# Patient Record
Sex: Female | Born: 1985 | Race: White | Hispanic: No | Marital: Married | Smoking: Never smoker
Health system: Southern US, Community
[De-identification: ages and names within clinical notes are randomized; demographics above are authoritative.]

## PROBLEM LIST (undated history)

## (undated) ENCOUNTER — Inpatient Hospital Stay (HOSPITAL_COMMUNITY): Payer: Self-pay

## (undated) DIAGNOSIS — E669 Obesity, unspecified: Secondary | ICD-10-CM

## (undated) DIAGNOSIS — N390 Urinary tract infection, site not specified: Secondary | ICD-10-CM

## (undated) DIAGNOSIS — R87619 Unspecified abnormal cytological findings in specimens from cervix uteri: Secondary | ICD-10-CM

## (undated) DIAGNOSIS — IMO0002 Reserved for concepts with insufficient information to code with codable children: Secondary | ICD-10-CM

## (undated) DIAGNOSIS — G43009 Migraine without aura, not intractable, without status migrainosus: Secondary | ICD-10-CM

## (undated) DIAGNOSIS — G43909 Migraine, unspecified, not intractable, without status migrainosus: Secondary | ICD-10-CM

## (undated) DIAGNOSIS — Z8739 Personal history of other diseases of the musculoskeletal system and connective tissue: Secondary | ICD-10-CM

## (undated) DIAGNOSIS — G4489 Other headache syndrome: Secondary | ICD-10-CM

## (undated) HISTORY — DX: Migraine, unspecified, not intractable, without status migrainosus: G43.909

## (undated) HISTORY — DX: Obesity, unspecified: E66.9

## (undated) HISTORY — DX: Reserved for concepts with insufficient information to code with codable children: IMO0002

## (undated) HISTORY — DX: Personal history of other diseases of the musculoskeletal system and connective tissue: Z87.39

## (undated) HISTORY — DX: Unspecified abnormal cytological findings in specimens from cervix uteri: R87.619

---
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
## 2010-04-30 ENCOUNTER — Ambulatory Visit: Payer: Self-pay | Admitting: Obstetrics & Gynecology

## 2010-05-01 ENCOUNTER — Encounter: Payer: Self-pay | Admitting: Obstetrics & Gynecology

## 2010-05-01 LAB — CONVERTED CEMR LAB
LDL Cholesterol: 99 mg/dL (ref 0–99)
Sex Hormone Binding: 22 nmol/L (ref 18–114)
TSH: 3.681 microintl units/mL (ref 0.350–4.500)
Testosterone: 88.92 ng/dL — ABNORMAL HIGH (ref 10–70)
VLDL: 18 mg/dL (ref 0–40)
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
## 2010-11-20 ENCOUNTER — Ambulatory Visit (INDEPENDENT_AMBULATORY_CARE_PROVIDER_SITE_OTHER): Payer: BC Managed Care – PPO | Admitting: Obstetrics & Gynecology

## 2010-11-20 DIAGNOSIS — Z3009 Encounter for other general counseling and advice on contraception: Secondary | ICD-10-CM

## 2010-11-21 NOTE — Assessment & Plan Note (Unsigned)
NAME:  [name] [name], AGAR                   ACCOUNT NO.:  [id number]  MEDICAL RECORD NO.:  [id number]           PATIENT TYPE:  LOCATION:  [facility] at [facility]           FACILITY:  PHYSICIAN:  Allie Bossier, MD             DATE OF BIRTH:  DATE OF SERVICE:  11/20/2010                                 CLINIC NOTE  [name] is a 25 year old single white G-0 who has recently started having intercourse.  She had one occasion of unprotected sex.  She did use withdrawal for birth control.  She had a period 2 weeks later.  She would like to get on a birth control method.  She has denied that she would like the pill and her other medical issue is that of hirsutism. So, we have in the past talked about using a pill with risperidone as a progestinal agent in it.  Please note her free testosterone level is elevated.  I have discussed the increased risk of blood clots with this particular OCP, but still recommended due to her hirsutism.  She understands the risks of blood clots and hypertension.  She will plan to start the Yaz on the first day of her next period.  She will come in for a blood pressure check after that.  All questions were answered. Samples of the Yaz were given and a prescription for generic Yasmin.     Allie Bossier, MD    MCD/MEDQ  D:  11/20/2010  T:  11/21/2010  Job:  [id number]

## 2011-04-27 ENCOUNTER — Ambulatory Visit (INDEPENDENT_AMBULATORY_CARE_PROVIDER_SITE_OTHER): Payer: BC Managed Care – PPO | Admitting: Obstetrics & Gynecology

## 2011-04-27 ENCOUNTER — Encounter: Payer: Self-pay | Admitting: Obstetrics & Gynecology

## 2011-04-27 VITALS — BP 110/80 | HR 84 | Ht 62.0 in | Wt 171.0 lb

## 2011-04-27 DIAGNOSIS — Z113 Encounter for screening for infections with a predominantly sexual mode of transmission: Secondary | ICD-10-CM

## 2011-04-27 DIAGNOSIS — Z1272 Encounter for screening for malignant neoplasm of vagina: Secondary | ICD-10-CM

## 2011-04-27 DIAGNOSIS — Z0000 Encounter for general adult medical examination without abnormal findings: Secondary | ICD-10-CM

## 2011-04-27 DIAGNOSIS — Z01419 Encounter for gynecological examination (general) (routine) without abnormal findings: Secondary | ICD-10-CM

## 2011-04-27 NOTE — Progress Notes (Signed)
Addended by: Barbara Cower on: 04/27/2011 06:03 PM   Modules accepted: Orders

## 2011-04-27 NOTE — Progress Notes (Signed)
Subjective:    [name] [name] is a 25 y.o. female who presents for an annual exam. The patient has no complaints today. The patient is sexually active. GYN screening history: last pap: was normal. The patient wears seatbelts: yes. The patient participates in regular exercise: no. Has the patient ever been transfused or tattooed?: no. The patient reports that there is not domestic violence in her life. She has noticed a decrease in her facial hair growth since starting generic Yasmin. Menstrual History: OB History    Grav Para Term Preterm Abortions TAB SAB Ect Mult Living                  Menarche age: 49 Patient's last menstrual period was 03/31/2011.    The following portions of the patient's history were reviewed and updated as appropriate: allergies, current medications, past family history, past medical history, past social history, past surgical history and problem list.  Review of Systems A comprehensive review of systems was negative. 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 She has been monogamous with her boyfriend for 7 months and lives with her mother.   Objective:    BP 110/80  Pulse 84  Ht 5\' 2"  (1.575 m)  Wt 171 lb (77.565 kg)  BMI 31.28 kg/m2  LMP 03/31/2011  General Appearance:    Alert, cooperative, no distress, appears stated age  Head:    Normocephalic, without obvious abnormality, atraumatic  Eyes:    PERRL, conjunctiva/corneas clear, EOM's intact, fundi    benign, both eyes  Ears:    Normal TM's and external ear canals, both ears  Nose:   Nares normal, septum midline, mucosa normal, no drainage    or sinus tenderness  Throat:   Lips, mucosa, and tongue normal; teeth and gums normal  Neck:   Supple, symmetrical, trachea midline, no adenopathy;    thyroid:  no enlargement/tenderness/nodules; no carotid   bruit or JVD  Back:     Symmetric, no curvature, ROM normal, no CVA tenderness  Lungs:     Clear to auscultation bilaterally, respirations unlabored  Chest Wall:    No  tenderness or deformity   Heart:    Regular rate and rhythm, S1 and S2 normal, no murmur, rub   or gallop  Breast Exam:    No tenderness, masses, or nipple abnormality  Abdomen:     Soft, non-tender, bowel sounds active all four quadrants,    no masses, no organomegaly  Genitalia:    Normal female without lesion, discharge or tenderness  Rectal:    Normal tone, normal prostate, no masses or tenderness;   guaiac negative stool  Extremities:   Extremities normal, atraumatic, no cyanosis or edema  Pulses:   2+ and symmetric all extremities  Skin:   Skin color, texture, turgor normal, no rashes or lesions  Lymph nodes:   Cervical, supraclavicular, and axillary nodes normal  Neurologic:   CNII-XII intact, normal strength, sensation and reflexes    throughout  .    Assessment:    Healthy female exam.    Plan:     Pap smear.

## 2011-05-13 NOTE — Progress Notes (Signed)
Appointment made for June 10, 2010. Results reviewed with patient.

## 2011-06-02 HISTORY — PX: COLPOSCOPY VULVA W/ BIOPSY: SUR282

## 2011-06-11 ENCOUNTER — Encounter: Payer: Self-pay | Admitting: Gynecology

## 2011-06-11 ENCOUNTER — Ambulatory Visit (INDEPENDENT_AMBULATORY_CARE_PROVIDER_SITE_OTHER): Payer: BC Managed Care – PPO | Admitting: Obstetrics & Gynecology

## 2011-06-11 VITALS — BP 127/87 | HR 124 | Ht 62.0 in | Wt 168.0 lb

## 2011-06-11 DIAGNOSIS — Z23 Encounter for immunization: Secondary | ICD-10-CM

## 2011-06-11 DIAGNOSIS — R87612 Low grade squamous intraepithelial lesion on cytologic smear of cervix (LGSIL): Secondary | ICD-10-CM

## 2011-06-11 NOTE — Progress Notes (Signed)
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 The prior pap showed no abnormalities.  Prior cervical/vaginal disease:  AW changes circumferentially, consistent with CIN 1. Prior cervical treatment: no treatment.  Procedure Details  The risks and benefits of the procedure and written informed consent obtained.  Speculum placed in vagina and excellent visualization of cervix achieved, cervix swabbed x 3 with acetic acid solution.  Findings: Adequate colposcopy Cervix: as above Vaginal inspection: vaginal colposcopy not performed. Vulvar colposcopy: vulvar colposcopy not performed.  Specimens:Ecc  Complications: none.  Plan: Specimens labelled and sent to Pathology.  She will get the Gardasil vaccine ASAP

## 2011-08-03 ENCOUNTER — Encounter: Payer: Self-pay | Admitting: Obstetrics & Gynecology

## 2011-09-07 ENCOUNTER — Ambulatory Visit: Payer: BC Managed Care – PPO

## 2011-09-14 ENCOUNTER — Ambulatory Visit (INDEPENDENT_AMBULATORY_CARE_PROVIDER_SITE_OTHER): Payer: BC Managed Care – PPO | Admitting: Gynecology

## 2011-09-14 DIAGNOSIS — Z23 Encounter for immunization: Secondary | ICD-10-CM

## 2011-12-07 ENCOUNTER — Ambulatory Visit: Payer: BC Managed Care – PPO

## 2011-12-07 DIAGNOSIS — Z23 Encounter for immunization: Secondary | ICD-10-CM

## 2011-12-09 ENCOUNTER — Other Ambulatory Visit: Payer: Self-pay | Admitting: Gynecology

## 2011-12-09 ENCOUNTER — Telehealth: Payer: Self-pay | Admitting: Gynecology

## 2011-12-09 DIAGNOSIS — Z309 Encounter for contraceptive management, unspecified: Secondary | ICD-10-CM

## 2011-12-09 MED ORDER — DROSPIRENONE-ETHINYL ESTRADIOL 3-0.02 MG PO TABS: 1.0000 | ORAL_TABLET | Freq: Every day | ORAL | Status: DC

## 2011-12-09 NOTE — Telephone Encounter (Signed)
Refill for birth control was sent to [facility].

## 2011-12-29 ENCOUNTER — Ambulatory Visit (INDEPENDENT_AMBULATORY_CARE_PROVIDER_SITE_OTHER): Payer: BC Managed Care – PPO | Admitting: Obstetrics and Gynecology

## 2011-12-29 VITALS — BP 122/92 | HR 116 | Ht 62.0 in | Wt 177.0 lb

## 2011-12-29 DIAGNOSIS — R87612 Low grade squamous intraepithelial lesion on cytologic smear of cervix (LGSIL): Secondary | ICD-10-CM | POA: Insufficient documentation

## 2011-12-29 DIAGNOSIS — IMO0002 Reserved for concepts with insufficient information to code with codable children: Secondary | ICD-10-CM | POA: Insufficient documentation

## 2011-12-29 NOTE — Progress Notes (Signed)
Patient ID: [name] [name], female   DOB: 1985-10-26, 26 y.o.   MRN: [id number] Patient with LGSIL on pap in 04/2011 followed by normal colposcopy in jan 2013 presenting today for repeat pap smear.  Pap smear performed. Patient will be contacted with abnormal results and plan is to return in 6 months for repeat pap smear

## 2012-06-10 ENCOUNTER — Ambulatory Visit (INDEPENDENT_AMBULATORY_CARE_PROVIDER_SITE_OTHER): Payer: BC Managed Care – PPO | Admitting: Obstetrics & Gynecology

## 2012-06-10 ENCOUNTER — Encounter: Payer: Self-pay | Admitting: Obstetrics & Gynecology

## 2012-06-10 VITALS — BP 135/97 | HR 118 | Ht 62.0 in | Wt 186.0 lb

## 2012-06-10 DIAGNOSIS — Z113 Encounter for screening for infections with a predominantly sexual mode of transmission: Secondary | ICD-10-CM

## 2012-06-10 DIAGNOSIS — Z0000 Encounter for general adult medical examination without abnormal findings: Secondary | ICD-10-CM

## 2012-06-10 DIAGNOSIS — R6889 Other general symptoms and signs: Secondary | ICD-10-CM

## 2012-06-10 DIAGNOSIS — Z23 Encounter for immunization: Secondary | ICD-10-CM

## 2012-06-10 DIAGNOSIS — IMO0002 Reserved for concepts with insufficient information to code with codable children: Secondary | ICD-10-CM

## 2012-06-10 DIAGNOSIS — Z124 Encounter for screening for malignant neoplasm of cervix: Secondary | ICD-10-CM

## 2012-06-10 DIAGNOSIS — Z1151 Encounter for screening for human papillomavirus (HPV): Secondary | ICD-10-CM

## 2012-06-10 DIAGNOSIS — Z01419 Encounter for gynecological examination (general) (routine) without abnormal findings: Secondary | ICD-10-CM

## 2012-06-10 LAB — LIPID PANEL
Total CHOL/HDL Ratio: 3.2 Ratio
VLDL: 38 mg/dL (ref 0–40)

## 2012-06-10 LAB — COMPREHENSIVE METABOLIC PANEL
AST: 11 U/L (ref 0–37)
Albumin: 4.4 g/dL (ref 3.5–5.2)
Alkaline Phosphatase: 39 U/L (ref 39–117)
Calcium: 9.6 mg/dL (ref 8.4–10.5)
Chloride: 106 mEq/L (ref 96–112)
Potassium: 3.9 mEq/L (ref 3.5–5.3)
Sodium: 138 mEq/L (ref 135–145)
Total Protein: 6.9 g/dL (ref 6.0–8.3)

## 2012-06-10 LAB — TSH: TSH: 3.152 u[IU]/mL (ref 0.350–4.500)

## 2012-06-10 NOTE — Patient Instructions (Addendum)
Place annual gynecologic exam patient instructions here. Calorie Counting Diet A calorie counting diet requires you to eat the number of calories that are right for you in a day. Calories are the measurement of how much energy you get from the food you eat. Eating the right amount of calories is important for staying at a healthy weight. If you eat too many calories, your body will store them as fat and you may gain weight. If you eat too few calories, you may lose weight. Counting the number of calories you eat during a day will help you know if you are eating the right amount. A Registered Dietitian can determine how many calories you need in a day. The amount of calories needed varies from person to person. If your goal is to lose weight, you will need to eat fewer calories. Losing weight can benefit you if you are overweight or have health problems such as heart disease, high blood pressure, or diabetes. If your goal is to gain weight, you will need to eat more calories. Gaining weight may be necessary if you have a certain health problem that causes your body to need more energy. TIPS Whether you are increasing or decreasing the number of calories you eat during a day, it may be hard to get used to changes in what you eat and drink. The following are tips to help you keep track of the number of calories you eat.  Measure foods at home with measuring cups. This helps you know the amount of food and number of calories you are eating.  Restaurants often serve food in amounts that are larger than 1 serving. While eating out, estimate how many servings of a food you are given. For example, a serving of cooked rice is  cup or about the size of half of a fist. Knowing serving sizes will help you be aware of how much food you are eating at restaurants.  Ask for smaller portion sizes or child-size portions at restaurants.  Plan to eat half of a meal at a restaurant. Take the rest home or share the other  half with a friend.  Read the Nutrition Facts panel on food labels for calorie content and serving size. You can find out how many servings are in a package, the size of a serving, and the number of calories each serving has.  For example, a package might contain 3 cookies. The Nutrition Facts panel on that package says that 1 serving is 1 cookie. Below that, it will say there are 3 servings in the container. The calories section of the Nutrition Facts label says there are 90 calories. This means there are 90 calories in 1 cookie (1 serving). If you eat 1 cookie you have eaten 90 calories. If you eat all 3 cookies, you have eaten 270 calories (3 servings x 90 calories = 270 calories). The list below tells you how big or small some common portion sizes are.  1 oz.........4 stacked dice.  3 oz........Marland KitchenDeck of cards.  1 tsp.......Marland KitchenTip of little finger.  1 tbs......Marland KitchenMarland KitchenThumb.  2 tbs.......Marland KitchenGolf ball.   cup......Marland KitchenHalf of a fist.  1 cup.......Marland KitchenA fist. KEEP A FOOD LOG Write down every food item you eat, the amount you eat, and the number of calories in each food you eat during the day. At the end of the day, you can add up the total number of calories you have eaten. It may help to keep a list like the one below. Find  out the calorie information by reading the Nutrition Facts panel on food labels. Breakfast  Bran cereal (1 cup, 110 calories).  Fat-free milk ( cup, 45 calories). Snack  Apple (1 medium, 80 calories). Lunch  Spinach (1 cup, 20 calories).  Tomato ( medium, 20 calories).  Chicken breast strips (3 oz, 165 calories).  Shredded cheddar cheese ( cup, 110 calories).  Light [place] dressing (2 tbs, 60 calories).  Whole-wheat bread (1 slice, 80 calories).  Tub margarine (1 tsp, 35 calories).  Vegetable soup (1 cup, 160 calories). Dinner  Pork chop (3 oz, 190 calories).  Brown rice (1 cup, 215 calories).  Steamed broccoli ( cup, 20 calories).  Strawberries (1   cup, 65 calories).  Whipped cream (1 tbs, 50 calories). Daily Calorie Total: 1425 Document Released: 05/18/2005 Document Revised: 08/10/2011 Document Reviewed: 11/12/2006 [facility] Patient Information 2013 [place], [place].
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
## 2012-06-16 ENCOUNTER — Telehealth: Payer: Self-pay | Admitting: *Deleted

## 2012-06-16 NOTE — Telephone Encounter (Signed)
Left message for patient to call me back. 

## 2012-06-16 NOTE — Telephone Encounter (Signed)
Patient is calling for lab results, she states she was not fasting and had eaten mcdonalds the morning of her labs.  She has a headache appointment in February and we will recheck her labs fasting to see if she indeed needs a referral to primary care.

## 2012-07-19 ENCOUNTER — Ambulatory Visit (INDEPENDENT_AMBULATORY_CARE_PROVIDER_SITE_OTHER): Payer: BC Managed Care – PPO | Admitting: Nurse Practitioner

## 2012-07-19 ENCOUNTER — Encounter: Payer: Self-pay | Admitting: Nurse Practitioner

## 2012-07-19 VITALS — BP 113/91 | HR 95 | Ht 62.0 in | Wt 186.0 lb

## 2012-07-19 DIAGNOSIS — G43009 Migraine without aura, not intractable, without status migrainosus: Secondary | ICD-10-CM

## 2012-07-19 DIAGNOSIS — E669 Obesity, unspecified: Secondary | ICD-10-CM

## 2012-07-19 DIAGNOSIS — O99210 Obesity complicating pregnancy, unspecified trimester: Secondary | ICD-10-CM | POA: Insufficient documentation

## 2012-07-19 DIAGNOSIS — G43909 Migraine, unspecified, not intractable, without status migrainosus: Secondary | ICD-10-CM

## 2012-07-19 DIAGNOSIS — Z13220 Encounter for screening for lipoid disorders: Secondary | ICD-10-CM

## 2012-07-19 HISTORY — DX: Migraine without aura, not intractable, without status migrainosus: G43.009

## 2012-07-19 LAB — LIPID PANEL: LDL Cholesterol: 90 mg/dL (ref 0–99)

## 2012-07-19 MED ORDER — RIZATRIPTAN BENZOATE 10 MG PO TABS: 10.0000 mg | ORAL_TABLET | ORAL | Status: DC | PRN

## 2012-07-19 MED ORDER — ONDANSETRON HCL 4 MG PO TABS: 4.0000 mg | ORAL_TABLET | Freq: Three times a day (TID) | ORAL | Status: DC | PRN

## 2012-07-19 NOTE — Patient Instructions (Signed)
Migraine Headache A migraine headache is an intense, throbbing pain on one or both sides of your head. A migraine can last for 30 minutes to several hours. CAUSES  The exact cause of a migraine headache is not always known. However, a migraine may be caused when nerves in the brain become irritated and release chemicals that cause inflammation. This causes pain. SYMPTOMS  Pain on one or both sides of your head.  Pulsating or throbbing pain.  Severe pain that prevents daily activities.  Pain that is aggravated by any physical activity.  Nausea, vomiting, or both.  Dizziness.  Pain with exposure to bright lights, loud noises, or activity.  General sensitivity to bright lights, loud noises, or smells. Before you get a migraine, you may get warning signs that a migraine is coming (aura). An aura may include:  Seeing flashing lights.  Seeing bright spots, halos, or zig-zag lines.  Having tunnel vision or blurred vision.  Having feelings of numbness or tingling.  Having trouble talking.  Having muscle weakness. MIGRAINE TRIGGERS  Alcohol.  Smoking.  Stress.  Menstruation.  Aged cheeses.  Foods or drinks that contain nitrates, glutamate, aspartame, or tyramine.  Lack of sleep.  Chocolate.  Caffeine.  Hunger.  Physical exertion.  Fatigue.  Medicines used to treat chest pain (nitroglycerine), birth control pills, estrogen, and some blood pressure medicines. DIAGNOSIS  A migraine headache is often diagnosed based on:  Symptoms.  Physical examination.  A CT scan or MRI of your head. TREATMENT Medicines may be given for pain and nausea. Medicines can also be given to help prevent recurrent migraines.  HOME CARE INSTRUCTIONS  Only take over-the-counter or prescription medicines for pain or discomfort as directed by your caregiver. The use of long-term narcotics is not recommended.  Lie down in a dark, quiet room when you have a migraine.  Keep a journal  to find out what may trigger your migraine headaches. For example, write down:  What you eat and drink.  How much sleep you get.  Any change to your diet or medicines.  Limit alcohol consumption.  Quit smoking if you smoke.  Get 7 to 9 hours of sleep, or as recommended by your caregiver.  Limit stress.  Keep lights dim if bright lights bother you and make your migraines worse. SEEK IMMEDIATE MEDICAL CARE IF:   Your migraine becomes severe.  You have a fever.  You have a stiff neck.  You have vision loss.  You have muscular weakness or loss of muscle control.  You start losing your balance or have trouble walking.  You feel faint or pass out.  You have severe symptoms that are different from your first symptoms. MAKE SURE YOU:   Understand these instructions.  Will watch your condition.  Will get help right away if you are not doing well or get worse. Document Released: 05/18/2005 Document Revised: 08/10/2011 Document Reviewed: 05/08/2011 [employer] Patient Information 2013 [employer].  
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
## 2012-11-23 ENCOUNTER — Ambulatory Visit (INDEPENDENT_AMBULATORY_CARE_PROVIDER_SITE_OTHER): Payer: BC Managed Care – PPO | Admitting: Family Medicine

## 2012-11-23 ENCOUNTER — Encounter: Payer: Self-pay | Admitting: Family Medicine

## 2012-11-23 VITALS — BP 127/86 | HR 99 | Ht 62.0 in | Wt 190.0 lb

## 2012-11-23 DIAGNOSIS — IMO0002 Reserved for concepts with insufficient information to code with codable children: Secondary | ICD-10-CM

## 2012-11-23 DIAGNOSIS — Z3041 Encounter for surveillance of contraceptive pills: Secondary | ICD-10-CM

## 2012-11-23 DIAGNOSIS — R6889 Other general symptoms and signs: Secondary | ICD-10-CM

## 2012-11-23 MED ORDER — DROSPIRENONE-ETHINYL ESTRADIOL 3-0.02 MG PO TABS: 1.0000 | ORAL_TABLET | Freq: Every day | ORAL | Status: DC

## 2012-11-23 NOTE — Progress Notes (Signed)
  Subjective:    Patient ID: [name] [name], female    DOB: 03-07-1986, 27 y.o.   MRN: [id number]  HPI  Here for f/u pap.  Had LGSIL in 11/12, with normal colpo.  S/p gardasil series.  Normal pap and neg HPV in 1/14.  Review of Systems  Gastrointestinal: Negative for abdominal pain.  Genitourinary: Negative for dysuria and vaginal discharge.  Musculoskeletal: Negative for back pain.       Objective:   Physical Exam  Vitals reviewed. Constitutional: She is oriented to person, place, and time. She appears well-developed and well-nourished.  HENT:  Head: Normocephalic and atraumatic.  Eyes: No scleral icterus.  Neck: Neck supple.  Cardiovascular: Normal rate.   Pulmonary/Chest: Effort normal.  Genitourinary: Vagina normal and uterus normal. Cervix exhibits no motion tenderness, no discharge and no friability. Right adnexum displays no mass and no tenderness. Left adnexum displays no mass and no tenderness.  Neurological: She is alert and oriented to person, place, and time.          Assessment & Plan:

## 2012-11-23 NOTE — Assessment & Plan Note (Signed)
Refill of OC's today

## 2012-11-23 NOTE — Patient Instructions (Signed)
Abnormal Pap Test Information During a Pap test, the cells on the surface of your cervix are checked to see if they look normal, abnormal, or if they show signs of having been altered by a certain type of virus called human papillomavirus, or HPV. Cervical cells that have been affected by HPV are called dysplasia. Dysplasia is not cancer, but describes abnormal cells found on the surface of the cervix. Depending on the degree of dysplasia, some of the cells may be considered pre-cancerous and may turn into cancer over time if follow up with a caregiver is delayed.  WHAT DOES AN ABNORMAL PAP TEST MEAN? Having an abnormal pap test does not mean that you have cancer. However, certain types of abnormal pap tests can be a sign that a person is at a higher risk of developing cancer. Your caregiver will want to do other tests to find out more about the abnormal cells. Your abnormal Pap test results could show:   Small and uncertain changes that should be carefully watched.   Cervical dysplasia that has caused mild changes and can be followed over time.  Cervical dysplasia that is more severe and needs to be followed and treated to ensure the problem goes away.  Cancer.  When severe cervical dysplasia is found and treated early, it rarely will grow into cancer.  WHAT WILL BE DONE ABOUT MY ABNORMAL PAP TEST?  A colposcopy may be needed. This is a procedure where your cervix is examined using light and magnification.  A small tissue sample of your cervix (biopsy) may need to be removed and then examined. This is often performed if there are areas that appear infected.  A sample of cells from the cervical canal may be removed with either a small brush or scraping instrument (curette). Based on the results of the procedures above, some caregivers may recommend either cryotherapy of the cervix or a surgical LEEP where a portion of the cervix is removed. LEEP is short for "loop electrical excisional  procedure." Rarely, a caregiver may recommend a cone biopsy.This is a procedure where a small, cone-shaped sample of your cervix is taken out. The part that is taken out is the area where the abnormal cells are.  WHAT IF I HAVE A DYSPLASIA OR A CANCER? You may be referred to a specialist. Radiation may also be a treatment for more advanced cancer. Having a hysterectomy is the last treatment option for dysplasia, but it is a more common treatment for someone with cancer. All treatment options will be discussed with you by your caregiver. WHAT SHOULD YOU DO AFTER BEING TREATED? If you have had an abnormal pap test, you should continue to have regular pap tests and check-ups as directed by your caregiver. Your cervical problem will be carefully watched so it does not get worse. Also, your caregiver can watch for, and treat, any new problems that may come up. Document Released: 09/02/2010 Document Revised: 08/10/2011 Document Reviewed: 05/14/2011 [employer] Patient Information 2014 [employer].  

## 2012-11-23 NOTE — Assessment & Plan Note (Signed)
F/u pap with HPV today--if normal may return to routine testing.

## 2012-11-25 ENCOUNTER — Ambulatory Visit: Payer: BC Managed Care – PPO | Admitting: Obstetrics & Gynecology

## 2013-04-06 ENCOUNTER — Other Ambulatory Visit: Payer: Self-pay

## 2013-06-01 NOTE — L&D Delivery Note (Signed)
Attestation of Attending Supervision of Advanced Practitioner (CNM/NP): Evaluation and management procedures were performed by the Advanced Practitioner under my supervision and collaboration.  I have reviewed the Advanced Practitioner's note and chart, and I agree with the management and plan.  Willadean Guyton 02/22/2014 9:56 AM

## 2013-06-01 NOTE — L&D Delivery Note (Signed)
Operative Delivery Note At 7:40 PM a viable female was delivered via Vaginal, Spontaneous Delivery.  Presentation: vertex; Position: Left,, Occiput,, Anterior; Station: +4.  Verbal consent: obtained from patient.  Risks and benefits discussed in detail.  Risks include, but are not limited to the risks of anesthesia, bleeding, infection, damage to maternal tissues, fetal cephalhematoma.  There is also the risk of inability to effect vaginal delivery of the head, or shoulder dystocia that cannot be resolved by established maneuvers, leading to the need for emergency cesarean section.  APGAR: 8, 9; weight 6 lb 13.5 oz (3104 g).   Placenta status: Intact, Spontaneous.   Cord: 3 vessels with the following complications: None.  Cord pH: pending  Anesthesia: Epidural  Instruments: kiwi Episiotomy: None Lacerations: None Suture Repair: n/a Est. Blood Loss (mL): 700  Mom to postpartum.  Baby to Couplet care / Skin to Skin.  LAWSON, MARIE DARLENE 02/19/2014, 1:41 AM

## 2013-06-19 ENCOUNTER — Encounter: Payer: Self-pay | Admitting: *Deleted

## 2013-06-19 ENCOUNTER — Ambulatory Visit (INDEPENDENT_AMBULATORY_CARE_PROVIDER_SITE_OTHER): Payer: BC Managed Care – PPO | Admitting: *Deleted

## 2013-06-19 VITALS — BP 117/85 | Wt 190.0 lb

## 2013-06-19 DIAGNOSIS — Z23 Encounter for immunization: Secondary | ICD-10-CM

## 2013-06-19 DIAGNOSIS — Z3400 Encounter for supervision of normal first pregnancy, unspecified trimester: Secondary | ICD-10-CM

## 2013-06-19 NOTE — Progress Notes (Signed)
P-115

## 2013-06-19 NOTE — Patient Instructions (Signed)

## 2013-06-20 LAB — OBSTETRIC PANEL
ANTIBODY SCREEN: NEGATIVE
Basophils Absolute: 0 10*3/uL (ref 0.0–0.1)
Basophils Relative: 0 % (ref 0–1)
EOS PCT: 1 % (ref 0–5)
Eosinophils Absolute: 0.1 10*3/uL (ref 0.0–0.7)
HEMATOCRIT: 39.9 % (ref 36.0–46.0)
HEMOGLOBIN: 13.7 g/dL (ref 12.0–15.0)
Hepatitis B Surface Ag: NEGATIVE
LYMPHS ABS: 2.2 10*3/uL (ref 0.7–4.0)
Lymphocytes Relative: 26 % (ref 12–46)
MCH: 29.8 pg (ref 26.0–34.0)
MCHC: 34.3 g/dL (ref 30.0–36.0)
MCV: 86.7 fL (ref 78.0–100.0)
MONO ABS: 0.4 10*3/uL (ref 0.1–1.0)
Monocytes Relative: 5 % (ref 3–12)
Neutro Abs: 5.5 10*3/uL (ref 1.7–7.7)
Neutrophils Relative %: 68 % (ref 43–77)
Platelets: 296 10*3/uL (ref 150–400)
RBC: 4.6 MIL/uL (ref 3.87–5.11)
RDW: 13.4 % (ref 11.5–15.5)
RUBELLA: 1.98 {index} — AB (ref <0.90)
Rh Type: POSITIVE
WBC: 8.2 10*3/uL (ref 4.0–10.5)

## 2013-06-20 LAB — HIV ANTIBODY (ROUTINE TESTING W REFLEX): HIV: NONREACTIVE

## 2013-06-20 LAB — CYSTIC FIBROSIS DIAGNOSTIC STUDY

## 2013-06-21 LAB — CULTURE, OB URINE: Colony Count: 25000

## 2013-06-29 ENCOUNTER — Ambulatory Visit (INDEPENDENT_AMBULATORY_CARE_PROVIDER_SITE_OTHER): Payer: BC Managed Care – PPO | Admitting: Obstetrics & Gynecology

## 2013-06-29 ENCOUNTER — Encounter: Payer: Self-pay | Admitting: Obstetrics & Gynecology

## 2013-06-29 VITALS — BP 125/92 | Wt 193.8 lb

## 2013-06-29 DIAGNOSIS — Z3400 Encounter for supervision of normal first pregnancy, unspecified trimester: Secondary | ICD-10-CM
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
## 2013-06-29 NOTE — Progress Notes (Signed)
P-109

## 2013-06-30 LAB — GC/CHLAMYDIA PROBE AMP, URINE
Chlamydia, Swab/Urine, PCR: NEGATIVE
GC Probe Amp, Urine: NEGATIVE

## 2013-07-04 NOTE — Progress Notes (Signed)
Bedside ultrasound done shows a CRL of 7weeks 5days, positive fetal heart rate.

## 2013-07-28 ENCOUNTER — Ambulatory Visit (INDEPENDENT_AMBULATORY_CARE_PROVIDER_SITE_OTHER): Payer: BC Managed Care – PPO | Admitting: Nurse Practitioner

## 2013-07-28 VITALS — BP 120/89 | Wt 190.2 lb

## 2013-07-28 DIAGNOSIS — Z3400 Encounter for supervision of normal first pregnancy, unspecified trimester: Secondary | ICD-10-CM

## 2013-07-28 NOTE — Progress Notes (Signed)
Nausea is improving just starting to feel like eating again. Rechecked BP with large cuff-stayed same. Advised to decrease salt/ fast food/ eat more fruit and veggies.

## 2013-07-28 NOTE — Progress Notes (Signed)
P- 115

## 2013-08-25 ENCOUNTER — Ambulatory Visit (INDEPENDENT_AMBULATORY_CARE_PROVIDER_SITE_OTHER): Payer: BC Managed Care – PPO | Admitting: Family Medicine

## 2013-08-25 ENCOUNTER — Encounter: Payer: Self-pay | Admitting: Family Medicine

## 2013-08-25 VITALS — BP 120/86 | Wt 191.0 lb

## 2013-08-25 DIAGNOSIS — Z3400 Encounter for supervision of normal first pregnancy, unspecified trimester: Secondary | ICD-10-CM

## 2013-08-25 NOTE — Progress Notes (Signed)
P-104

## 2013-08-25 NOTE — Progress Notes (Signed)
Anatomy u/s ordered. Doing well

## 2013-08-25 NOTE — Patient Instructions (Signed)
Second Trimester of Pregnancy The second trimester is from week 13 through week 28, months 4 through 6. The second trimester is often a time when you feel your best. Your body has also adjusted to being pregnant, and you begin to feel better physically. Usually, morning sickness has lessened or quit completely, you may have more energy, and you may have an increase in appetite. The second trimester is also a time when the fetus is growing rapidly. At the end of the sixth month, the fetus is about 9 inches long and weighs about 1 pounds. You will likely begin to feel the baby move (quickening) between 18 and 20 weeks of the pregnancy. BODY CHANGES Your body goes through many changes during pregnancy. The changes vary from woman to woman.   Your weight will continue to increase. You will notice your lower abdomen bulging out.  You may begin to get stretch marks on your hips, abdomen, and breasts.  You may develop headaches that can be relieved by medicines approved by your caregiver.  You may urinate more often because the fetus is pressing on your bladder.  You may develop or continue to have heartburn as a result of your pregnancy.  You may develop constipation because certain hormones are causing the muscles that push waste through your intestines to slow down.  You may develop hemorrhoids or swollen, bulging veins (varicose veins).  You may have back pain because of the weight gain and pregnancy hormones relaxing your joints between the bones in your pelvis and as a result of a shift in weight and the muscles that support your balance.  Your breasts will continue to grow and be tender.  Your gums may bleed and may be sensitive to brushing and flossing.  Dark spots or blotches (chloasma, mask of pregnancy) may develop on your face. This will likely fade after the baby is born.  A dark line from your belly button to the pubic area (linea nigra) may appear. This will likely fade after  the baby is born. WHAT TO EXPECT AT YOUR PRENATAL VISITS During a routine prenatal visit:  You will be weighed to make sure you and the fetus are growing normally.  Your blood pressure will be taken.  Your abdomen will be measured to track your baby's growth.  The fetal heartbeat will be listened to.  Any test results from the previous visit will be discussed. Your caregiver may ask you:  How you are feeling.  If you are feeling the baby move.  If you have had any abnormal symptoms, such as leaking fluid, bleeding, severe headaches, or abdominal cramping.  If you have any questions. Other tests that may be performed during your second trimester include:  Blood tests that check for:  Low iron levels (anemia).  Gestational diabetes (between 24 and 28 weeks).  Rh antibodies.  Urine tests to check for infections, diabetes, or protein in the urine.  An ultrasound to confirm the proper growth and development of the baby.  An amniocentesis to check for possible genetic problems.  Fetal screens for spina bifida and Down syndrome. HOME CARE INSTRUCTIONS   Avoid all smoking, herbs, alcohol, and unprescribed drugs. These chemicals affect the formation and growth of the baby.  Follow your caregiver's instructions regarding medicine use. There are medicines that are either safe or unsafe to take during pregnancy.  Exercise only as directed by your caregiver. Experiencing uterine cramps is a good sign to stop exercising.  Continue to eat regular,   healthy meals.  Wear a good support bra for breast tenderness.  Do not use hot tubs, steam rooms, or saunas.  Wear your seat belt at all times when driving.  Avoid raw meat, uncooked cheese, cat litter boxes, and soil used by cats. These carry germs that can cause birth defects in the baby.  Take your prenatal vitamins.  Try taking a stool softener (if your caregiver approves) if you develop constipation. Eat more high-fiber  foods, such as fresh vegetables or fruit and whole grains. Drink plenty of fluids to keep your urine clear or pale yellow.  Take warm sitz baths to soothe any pain or discomfort caused by hemorrhoids. Use hemorrhoid cream if your caregiver approves.  If you develop varicose veins, wear support hose. Elevate your feet for 15 minutes, 3 4 times a day. Limit salt in your diet.  Avoid heavy lifting, wear low heel shoes, and practice good posture.  Rest with your legs elevated if you have leg cramps or low back pain.  Visit your dentist if you have not gone yet during your pregnancy. Use a soft toothbrush to brush your teeth and be gentle when you floss.  A sexual relationship may be continued unless your caregiver directs you otherwise.  Continue to go to all your prenatal visits as directed by your caregiver. SEEK MEDICAL CARE IF:   You have dizziness.  You have mild pelvic cramps, pelvic pressure, or nagging pain in the abdominal area.  You have persistent nausea, vomiting, or diarrhea.  You have a bad smelling vaginal discharge.  You have pain with urination. SEEK IMMEDIATE MEDICAL CARE IF:   You have a fever.  You are leaking fluid from your vagina.  You have spotting or bleeding from your vagina.  You have severe abdominal cramping or pain.  You have rapid weight gain or loss.  You have shortness of breath with chest pain.  You notice sudden or extreme swelling of your face, hands, ankles, feet, or legs.  You have not felt your baby move in over an hour.  You have severe headaches that do not go away with medicine.  You have vision changes. Document Released: 05/12/2001 Document Revised: 01/18/2013 Document Reviewed: 07/19/2012 [employer] Patient Information 2014 [employer].  Breastfeeding Deciding to breastfeed is one of the best choices you can make for you and your baby. A change in hormones during pregnancy causes your breast tissue to grow and increases the  number and size of your milk ducts. These hormones also allow proteins, sugars, and fats from your blood supply to make breast milk in your milk-producing glands. Hormones prevent breast milk from being released before your baby is born as well as prompt milk flow after birth. Once breastfeeding has begun, thoughts of your baby, as well as his or her sucking or crying, can stimulate the release of milk from your milk-producing glands.  BENEFITS OF BREASTFEEDING For Your Baby  Your first milk (colostrum) helps your baby's digestive system function better.   There are antibodies in your milk that help your baby fight off infections.   Your baby has a lower incidence of asthma, allergies, and sudden infant death syndrome.   The nutrients in breast milk are better for your baby than infant formulas and are designed uniquely for your baby's needs.   Breast milk improves your baby's brain development.   Your baby is less likely to develop other conditions, such as childhood obesity, asthma, or type 2 diabetes mellitus.  For   You   Breastfeeding helps to create a very special bond between you and your baby.   Breastfeeding is convenient. Breast milk is always available at the correct temperature and costs nothing.   Breastfeeding helps to burn calories and helps you lose the weight gained during pregnancy.   Breastfeeding makes your uterus contract to its prepregnancy size faster and slows bleeding (lochia) after you give birth.   Breastfeeding helps to lower your risk of developing type 2 diabetes mellitus, osteoporosis, and breast or ovarian cancer later in life. SIGNS THAT YOUR BABY IS HUNGRY Early Signs of Hunger  Increased alertness or activity.  Stretching.  Movement of the head from side to side.  Movement of the head and opening of the mouth when the corner of the mouth or cheek is stroked (rooting).  Increased sucking sounds, smacking lips, cooing, sighing, or  squeaking.  Hand-to-mouth movements.  Increased sucking of fingers or hands. Late Signs of Hunger  Fussing.  Intermittent crying. Extreme Signs of Hunger Signs of extreme hunger will require calming and consoling before your baby will be able to breastfeed successfully. Do not wait for the following signs of extreme hunger to occur before you initiate breastfeeding:   Restlessness.  A loud, strong cry.   Screaming. BREASTFEEDING BASICS Breastfeeding Initiation  Find a comfortable place to sit or lie down, with your neck and back well supported.  Place a pillow or rolled up blanket under your baby to bring him or her to the level of your breast (if you are seated). Nursing pillows are specially designed to help support your arms and your baby while you breastfeed.  Make sure that your baby's abdomen is facing your abdomen.   Gently massage your breast. With your fingertips, massage from your chest wall toward your nipple in a circular motion. This encourages milk flow. You may need to continue this action during the feeding if your milk flows slowly.  Support your breast with 4 fingers underneath and your thumb above your nipple. Make sure your fingers are well away from your nipple and your baby's mouth.   Stroke your baby's lips gently with your finger or nipple.   When your baby's mouth is open wide enough, quickly bring your baby to your breast, placing your entire nipple and as much of the colored area around your nipple (areola) as possible into your baby's mouth.   More areola should be visible above your baby's upper lip than below the lower lip.   Your baby's tongue should be between his or her lower gum and your breast.   Ensure that your baby's mouth is correctly positioned around your nipple (latched). Your baby's lips should create a seal on your breast and be turned out (everted).  It is common for your baby to suck about 2 3 minutes in order to start the  flow of breast milk. Latching Teaching your baby how to latch on to your breast properly is very important. An improper latch can cause nipple pain and decreased milk supply for you and poor weight gain in your baby. Also, if your baby is not latched onto your nipple properly, he or she may swallow some air during feeding. This can make your baby fussy. Burping your baby when you switch breasts during the feeding can help to get rid of the air. However, teaching your baby to latch on properly is still the best way to prevent fussiness from swallowing air while breastfeeding. Signs that your baby has   successfully latched on to your nipple:    Silent tugging or silent sucking, without causing you pain.   Swallowing heard between every 3 4 sucks.    Muscle movement above and in front of his or her ears while sucking.  Signs that your baby has not successfully latched on to nipple:   Sucking sounds or smacking sounds from your baby while breastfeeding.  Nipple pain. If you think your baby has not latched on correctly, slip your finger into the corner of your baby's mouth to break the suction and place it between your baby's gums. Attempt breastfeeding initiation again. Signs of Successful Breastfeeding Signs from your baby:   A gradual decrease in the number of sucks or complete cessation of sucking.   Falling asleep.   Relaxation of his or her body.   Retention of a small amount of milk in his or her mouth.   Letting go of your breast by himself or herself. Signs from you:  Breasts that have increased in firmness, weight, and size 1 3 hours after feeding.   Breasts that are softer immediately after breastfeeding.  Increased milk volume, as well as a change in milk consistency and color by the 5th day of breastfeeding.   Nipples that are not sore, cracked, or bleeding. Signs That Your Baby is Getting Enough Milk  Wetting at least 3 diapers in a 24-hour period. The urine  should be clear and pale yellow by age 5 days.  At least 3 stools in a 24-hour period by age 5 days. The stool should be soft and yellow.  At least 3 stools in a 24-hour period by age 7 days. The stool should be seedy and yellow.  No loss of weight greater than 10% of birth weight during the first 3 days of age.  Average weight gain of 4 7 ounces (120 210 mL) per week after age 4 days.  Consistent daily weight gain by age 5 days, without weight loss after the age of 2 weeks. After a feeding, your baby may spit up a small amount. This is common. BREASTFEEDING FREQUENCY AND DURATION Frequent feeding will help you make more milk and can prevent sore nipples and breast engorgement. Breastfeed when you feel the need to reduce the fullness of your breasts or when your baby shows signs of hunger. This is called "breastfeeding on demand." Avoid introducing a pacifier to your baby while you are working to establish breastfeeding (the first 4 6 weeks after your baby is born). After this time you may choose to use a pacifier. Research has shown that pacifier use during the first year of a baby's life decreases the risk of sudden infant death syndrome (SIDS). Allow your baby to feed on each breast as long as he or she wants. Breastfeed until your baby is finished feeding. When your baby unlatches or falls asleep while feeding from the first breast, offer the second breast. Because newborns are often sleepy in the first few weeks of life, you may need to awaken your baby to get him or her to feed. Breastfeeding times will vary from baby to baby. However, the following rules can serve as a guide to help you ensure that your baby is properly fed:  Newborns (babies 4 weeks of age or younger) may breastfeed every 1 3 hours.  Newborns should not go longer than 3 hours during the day or 5 hours during the night without breastfeeding.  You should breastfeed your baby a minimum of   8 times in a 24-hour period until  you begin to introduce solid foods to your baby at around 6 months of age. BREAST MILK PUMPING Pumping and storing breast milk allows you to ensure that your baby is exclusively fed your breast milk, even at times when you are unable to breastfeed. This is especially important if you are going back to work while you are still breastfeeding or when you are not able to be present during feedings. Your lactation consultant can give you guidelines on how long it is safe to store breast milk.  A breast pump is a machine that allows you to pump milk from your breast into a sterile bottle. The pumped breast milk can then be stored in a refrigerator or freezer. Some breast pumps are operated by hand, while others use electricity. Ask your lactation consultant which type will work best for you. Breast pumps can be purchased, but some hospitals and breastfeeding support groups lease breast pumps on a monthly basis. A lactation consultant can teach you how to hand express breast milk, if you prefer not to use a pump.  CARING FOR YOUR BREASTS WHILE YOU BREASTFEED Nipples can become dry, cracked, and sore while breastfeeding. The following recommendations can help keep your breasts moisturized and healthy:  Avoid using soap on your nipples.   Wear a supportive bra. Although not required, special nursing bras and tank tops are designed to allow access to your breasts for breastfeeding without taking off your entire bra or top. Avoid wearing underwire style bras or extremely tight bras.  Air dry your nipples for 3 4minutes after each feeding.   Use only cotton bra pads to absorb leaked breast milk. Leaking of breast milk between feedings is normal.   Use lanolin on your nipples after breastfeeding. Lanolin helps to maintain your skin's normal moisture barrier. If you use pure lanolin you do not need to wash it off before feeding your baby again. Pure lanolin is not toxic to your baby. You may also hand express a  few drops of breast milk and gently massage that milk into your nipples and allow the milk to air dry. In the first few weeks after giving birth, some women experience extremely full breasts (engorgement). Engorgement can make your breasts feel heavy, warm, and tender to the touch. Engorgement peaks within 3 5 days after you give birth. The following recommendations can help ease engorgement:  Completely empty your breasts while breastfeeding or pumping. You may want to start by applying warm, moist heat (in the shower or with warm water-soaked hand towels) just before feeding or pumping. This increases circulation and helps the milk flow. If your baby does not completely empty your breasts while breastfeeding, pump any extra milk after he or she is finished.  Wear a snug bra (nursing or regular) or tank top for 1 2 days to signal your body to slightly decrease milk production.  Apply ice packs to your breasts, unless this is too uncomfortable for you.  Make sure that your baby is latched on and positioned properly while breastfeeding. If engorgement persists after 48 hours of following these recommendations, contact your health care provider or a lactation consultant. OVERALL HEALTH CARE RECOMMENDATIONS WHILE BREASTFEEDING  Eat healthy foods. Alternate between meals and snacks, eating 3 of each per day. Because what you eat affects your breast milk, some of the foods may make your baby more irritable than usual. Avoid eating these foods if you are sure that they are   negatively affecting your baby.  Drink milk, fruit juice, and water to satisfy your thirst (about 10 glasses a day).   Rest often, relax, and continue to take your prenatal vitamins to prevent fatigue, stress, and anemia.  Continue breast self-awareness checks.  Avoid chewing and smoking tobacco.  Avoid alcohol and drug use. Some medicines that may be harmful to your baby can pass through breast milk. It is important to ask your  health care provider before taking any medicine, including all over-the-counter and prescription medicine as well as vitamin and herbal supplements. It is possible to become pregnant while breastfeeding. If birth control is desired, ask your health care provider about options that will be safe for your baby. SEEK MEDICAL CARE IF:   You feel like you want to stop breastfeeding or have become frustrated with breastfeeding.  You have painful breasts or nipples.  Your nipples are cracked or bleeding.  Your breasts are red, tender, or warm.  You have a swollen area on either breast.  You have a fever or chills.  You have nausea or vomiting.  You have drainage other than breast milk from your nipples.  Your breasts do not become full before feedings by the 5th day after you give birth.  You feel sad and depressed.  Your baby is too sleepy to eat well.  Your baby is having trouble sleeping.   Your baby is wetting less than 3 diapers in a 24-hour period.  Your baby has less than 3 stools in a 24-hour period.  Your baby's skin or the white part of his or her eyes becomes yellow.   Your baby is not gaining weight by 5 days of age. SEEK IMMEDIATE MEDICAL CARE IF:   Your baby is overly tired (lethargic) and does not want to wake up and feed.  Your baby develops an unexplained fever. Document Released: 05/18/2005 Document Revised: 01/18/2013 Document Reviewed: 11/09/2012 [employer] Patient Information 2014 [employer].  

## 2013-09-13 ENCOUNTER — Other Ambulatory Visit: Payer: Self-pay | Admitting: Family Medicine

## 2013-09-13 ENCOUNTER — Ambulatory Visit (HOSPITAL_COMMUNITY)
Admission: RE | Admit: 2013-09-13 | Discharge: 2013-09-13 | Disposition: A | Discharge status: Home / Self Care | Payer: BC Managed Care – PPO | Source: Ambulatory Visit | Attending: Family Medicine | Admitting: Family Medicine

## 2013-09-13 DIAGNOSIS — Z3400 Encounter for supervision of normal first pregnancy, unspecified trimester: Secondary | ICD-10-CM

## 2013-09-13 DIAGNOSIS — Z3689 Encounter for other specified antenatal screening: Secondary | ICD-10-CM | POA: Insufficient documentation

## 2013-09-14 ENCOUNTER — Encounter: Payer: Self-pay | Admitting: Family Medicine

## 2013-09-22 ENCOUNTER — Ambulatory Visit (INDEPENDENT_AMBULATORY_CARE_PROVIDER_SITE_OTHER): Payer: BC Managed Care – PPO | Admitting: Obstetrics & Gynecology

## 2013-09-22 ENCOUNTER — Encounter: Payer: Self-pay | Admitting: Obstetrics & Gynecology

## 2013-09-22 VITALS — BP 117/87 | HR 100 | Wt 192.0 lb

## 2013-09-22 DIAGNOSIS — Z3400 Encounter for supervision of normal first pregnancy, unspecified trimester: Secondary | ICD-10-CM

## 2013-09-22 DIAGNOSIS — E669 Obesity, unspecified: Secondary | ICD-10-CM

## 2013-09-22 DIAGNOSIS — O99210 Obesity complicating pregnancy, unspecified trimester: Secondary | ICD-10-CM

## 2013-09-22 NOTE — Progress Notes (Signed)
Routine visit. Good FM. No problems. Early glucola today. Her anatomy u/s said "limited views of the heart". She will get a follow up u/s in 4 weeks.

## 2013-09-23 LAB — GLUCOSE TOLERANCE, 1 HOUR (50G) W/O FASTING: GLUCOSE 1 HOUR GTT: 128 mg/dL (ref 70–140)

## 2013-09-25 ENCOUNTER — Encounter: Payer: Self-pay | Admitting: Obstetrics & Gynecology

## 2013-10-20 ENCOUNTER — Encounter: Payer: Self-pay | Admitting: Family Medicine

## 2013-10-20 ENCOUNTER — Ambulatory Visit (INDEPENDENT_AMBULATORY_CARE_PROVIDER_SITE_OTHER): Payer: BC Managed Care – PPO | Admitting: Family Medicine

## 2013-10-20 VITALS — BP 131/84 | HR 96 | Wt 195.0 lb

## 2013-10-20 DIAGNOSIS — Z3400 Encounter for supervision of normal first pregnancy, unspecified trimester: Secondary | ICD-10-CM

## 2013-10-20 NOTE — Progress Notes (Signed)
PTL precautions Anatomy reviewed--she does not want another [place] 28 wk labs and TDaP next visit. Discussed weight gain

## 2013-10-20 NOTE — Patient Instructions (Signed)
Second Trimester of Pregnancy The second trimester is from week 13 through week 28, months 4 through 6. The second trimester is often a time when you feel your best. Your body has also adjusted to being pregnant, and you begin to feel better physically. Usually, morning sickness has lessened or quit completely, you may have more energy, and you may have an increase in appetite. The second trimester is also a time when the fetus is growing rapidly. At the end of the sixth month, the fetus is about 9 inches long and weighs about 1 pounds. You will likely begin to feel the baby move (quickening) between 18 and 20 weeks of the pregnancy. BODY CHANGES Your body goes through many changes during pregnancy. The changes vary from woman to woman.   Your weight will continue to increase. You will notice your lower abdomen bulging out.  You may begin to get stretch marks on your hips, abdomen, and breasts.  You may develop headaches that can be relieved by medicines approved by your caregiver.  You may urinate more often because the fetus is pressing on your bladder.  You may develop or continue to have heartburn as a result of your pregnancy.  You may develop constipation because certain hormones are causing the muscles that push waste through your intestines to slow down.  You may develop hemorrhoids or swollen, bulging veins (varicose veins).  You may have back pain because of the weight gain and pregnancy hormones relaxing your joints between the bones in your pelvis and as a result of a shift in weight and the muscles that support your balance.  Your breasts will continue to grow and be tender.  Your gums may bleed and may be sensitive to brushing and flossing.  Dark spots or blotches (chloasma, mask of pregnancy) may develop on your face. This will likely fade after the baby is born.  A dark line from your belly button to the pubic area (linea nigra) may appear. This will likely fade after  the baby is born. WHAT TO EXPECT AT YOUR PRENATAL VISITS During a routine prenatal visit:  You will be weighed to make sure you and the fetus are growing normally.  Your blood pressure will be taken.  Your abdomen will be measured to track your baby's growth.  The fetal heartbeat will be listened to.  Any test results from the previous visit will be discussed. Your caregiver may ask you:  How you are feeling.  If you are feeling the baby move.  If you have had any abnormal symptoms, such as leaking fluid, bleeding, severe headaches, or abdominal cramping.  If you have any questions. Other tests that may be performed during your second trimester include:  Blood tests that check for:  Low iron levels (anemia).  Gestational diabetes (between 24 and 28 weeks).  Rh antibodies.  Urine tests to check for infections, diabetes, or protein in the urine.  An ultrasound to confirm the proper growth and development of the baby.  An amniocentesis to check for possible genetic problems.  Fetal screens for spina bifida and Down syndrome. HOME CARE INSTRUCTIONS   Avoid all smoking, herbs, alcohol, and unprescribed drugs. These chemicals affect the formation and growth of the baby.  Follow your caregiver's instructions regarding medicine use. There are medicines that are either safe or unsafe to take during pregnancy.  Exercise only as directed by your caregiver. Experiencing uterine cramps is a good sign to stop exercising.  Continue to eat regular,   healthy meals.  Wear a good support bra for breast tenderness.  Do not use hot tubs, steam rooms, or saunas.  Wear your seat belt at all times when driving.  Avoid raw meat, uncooked cheese, cat litter boxes, and soil used by cats. These carry germs that can cause birth defects in the baby.  Take your prenatal vitamins.  Try taking a stool softener (if your caregiver approves) if you develop constipation. Eat more high-fiber  foods, such as fresh vegetables or fruit and whole grains. Drink plenty of fluids to keep your urine clear or pale yellow.  Take warm sitz baths to soothe any pain or discomfort caused by hemorrhoids. Use hemorrhoid cream if your caregiver approves.  If you develop varicose veins, wear support hose. Elevate your feet for 15 minutes, 3 4 times a day. Limit salt in your diet.  Avoid heavy lifting, wear low heel shoes, and practice good posture.  Rest with your legs elevated if you have leg cramps or low back pain.  Visit your dentist if you have not gone yet during your pregnancy. Use a soft toothbrush to brush your teeth and be gentle when you floss.  A sexual relationship may be continued unless your caregiver directs you otherwise.  Continue to go to all your prenatal visits as directed by your caregiver. SEEK MEDICAL CARE IF:   You have dizziness.  You have mild pelvic cramps, pelvic pressure, or nagging pain in the abdominal area.  You have persistent nausea, vomiting, or diarrhea.  You have a bad smelling vaginal discharge.  You have pain with urination. SEEK IMMEDIATE MEDICAL CARE IF:   You have a fever.  You are leaking fluid from your vagina.  You have spotting or bleeding from your vagina.  You have severe abdominal cramping or pain.  You have rapid weight gain or loss.  You have shortness of breath with chest pain.  You notice sudden or extreme swelling of your face, hands, ankles, feet, or legs.  You have not felt your baby move in over an hour.  You have severe headaches that do not go away with medicine.  You have vision changes. Document Released: 05/12/2001 Document Revised: 01/18/2013 Document Reviewed: 07/19/2012 [employer] Patient Information 2014 [employer].  Breastfeeding Deciding to breastfeed is one of the best choices you can make for you and your baby. A change in hormones during pregnancy causes your breast tissue to grow and increases the  number and size of your milk ducts. These hormones also allow proteins, sugars, and fats from your blood supply to make breast milk in your milk-producing glands. Hormones prevent breast milk from being released before your baby is born as well as prompt milk flow after birth. Once breastfeeding has begun, thoughts of your baby, as well as his or her sucking or crying, can stimulate the release of milk from your milk-producing glands.  BENEFITS OF BREASTFEEDING For Your Baby  Your first milk (colostrum) helps your baby's digestive system function better.   There are antibodies in your milk that help your baby fight off infections.   Your baby has a lower incidence of asthma, allergies, and sudden infant death syndrome.   The nutrients in breast milk are better for your baby than infant formulas and are designed uniquely for your baby's needs.   Breast milk improves your baby's brain development.   Your baby is less likely to develop other conditions, such as childhood obesity, asthma, or type 2 diabetes mellitus.  For   You   Breastfeeding helps to create a very special bond between you and your baby.   Breastfeeding is convenient. Breast milk is always available at the correct temperature and costs nothing.   Breastfeeding helps to burn calories and helps you lose the weight gained during pregnancy.   Breastfeeding makes your uterus contract to its prepregnancy size faster and slows bleeding (lochia) after you give birth.   Breastfeeding helps to lower your risk of developing type 2 diabetes mellitus, osteoporosis, and breast or ovarian cancer later in life. SIGNS THAT YOUR BABY IS HUNGRY Early Signs of Hunger  Increased alertness or activity.  Stretching.  Movement of the head from side to side.  Movement of the head and opening of the mouth when the corner of the mouth or cheek is stroked (rooting).  Increased sucking sounds, smacking lips, cooing, sighing, or  squeaking.  Hand-to-mouth movements.  Increased sucking of fingers or hands. Late Signs of Hunger  Fussing.  Intermittent crying. Extreme Signs of Hunger Signs of extreme hunger will require calming and consoling before your baby will be able to breastfeed successfully. Do not wait for the following signs of extreme hunger to occur before you initiate breastfeeding:   Restlessness.  A loud, strong cry.   Screaming. BREASTFEEDING BASICS Breastfeeding Initiation  Find a comfortable place to sit or lie down, with your neck and back well supported.  Place a pillow or rolled up blanket under your baby to bring him or her to the level of your breast (if you are seated). Nursing pillows are specially designed to help support your arms and your baby while you breastfeed.  Make sure that your baby's abdomen is facing your abdomen.   Gently massage your breast. With your fingertips, massage from your chest wall toward your nipple in a circular motion. This encourages milk flow. You may need to continue this action during the feeding if your milk flows slowly.  Support your breast with 4 fingers underneath and your thumb above your nipple. Make sure your fingers are well away from your nipple and your baby's mouth.   Stroke your baby's lips gently with your finger or nipple.   When your baby's mouth is open wide enough, quickly bring your baby to your breast, placing your entire nipple and as much of the colored area around your nipple (areola) as possible into your baby's mouth.   More areola should be visible above your baby's upper lip than below the lower lip.   Your baby's tongue should be between his or her lower gum and your breast.   Ensure that your baby's mouth is correctly positioned around your nipple (latched). Your baby's lips should create a seal on your breast and be turned out (everted).  It is common for your baby to suck about 2 3 minutes in order to start the  flow of breast milk. Latching Teaching your baby how to latch on to your breast properly is very important. An improper latch can cause nipple pain and decreased milk supply for you and poor weight gain in your baby. Also, if your baby is not latched onto your nipple properly, he or she may swallow some air during feeding. This can make your baby fussy. Burping your baby when you switch breasts during the feeding can help to get rid of the air. However, teaching your baby to latch on properly is still the best way to prevent fussiness from swallowing air while breastfeeding. Signs that your baby has   successfully latched on to your nipple:    Silent tugging or silent sucking, without causing you pain.   Swallowing heard between every 3 4 sucks.    Muscle movement above and in front of his or her ears while sucking.  Signs that your baby has not successfully latched on to nipple:   Sucking sounds or smacking sounds from your baby while breastfeeding.  Nipple pain. If you think your baby has not latched on correctly, slip your finger into the corner of your baby's mouth to break the suction and place it between your baby's gums. Attempt breastfeeding initiation again. Signs of Successful Breastfeeding Signs from your baby:   A gradual decrease in the number of sucks or complete cessation of sucking.   Falling asleep.   Relaxation of his or her body.   Retention of a small amount of milk in his or her mouth.   Letting go of your breast by himself or herself. Signs from you:  Breasts that have increased in firmness, weight, and size 1 3 hours after feeding.   Breasts that are softer immediately after breastfeeding.  Increased milk volume, as well as a change in milk consistency and color by the 5th day of breastfeeding.   Nipples that are not sore, cracked, or bleeding. Signs That Your Baby is Getting Enough Milk  Wetting at least 3 diapers in a 24-hour period. The urine  should be clear and pale yellow by age 5 days.  At least 3 stools in a 24-hour period by age 5 days. The stool should be soft and yellow.  At least 3 stools in a 24-hour period by age 7 days. The stool should be seedy and yellow.  No loss of weight greater than 10% of birth weight during the first 3 days of age.  Average weight gain of 4 7 ounces (120 210 mL) per week after age 4 days.  Consistent daily weight gain by age 5 days, without weight loss after the age of 2 weeks. After a feeding, your baby may spit up a small amount. This is common. BREASTFEEDING FREQUENCY AND DURATION Frequent feeding will help you make more milk and can prevent sore nipples and breast engorgement. Breastfeed when you feel the need to reduce the fullness of your breasts or when your baby shows signs of hunger. This is called "breastfeeding on demand." Avoid introducing a pacifier to your baby while you are working to establish breastfeeding (the first 4 6 weeks after your baby is born). After this time you may choose to use a pacifier. Research has shown that pacifier use during the first year of a baby's life decreases the risk of sudden infant death syndrome (SIDS). Allow your baby to feed on each breast as long as he or she wants. Breastfeed until your baby is finished feeding. When your baby unlatches or falls asleep while feeding from the first breast, offer the second breast. Because newborns are often sleepy in the first few weeks of life, you may need to awaken your baby to get him or her to feed. Breastfeeding times will vary from baby to baby. However, the following rules can serve as a guide to help you ensure that your baby is properly fed:  Newborns (babies 4 weeks of age or younger) may breastfeed every 1 3 hours.  Newborns should not go longer than 3 hours during the day or 5 hours during the night without breastfeeding.  You should breastfeed your baby a minimum of   8 times in a 24-hour period until  you begin to introduce solid foods to your baby at around 6 months of age. BREAST MILK PUMPING Pumping and storing breast milk allows you to ensure that your baby is exclusively fed your breast milk, even at times when you are unable to breastfeed. This is especially important if you are going back to work while you are still breastfeeding or when you are not able to be present during feedings. Your lactation consultant can give you guidelines on how long it is safe to store breast milk.  A breast pump is a machine that allows you to pump milk from your breast into a sterile bottle. The pumped breast milk can then be stored in a refrigerator or freezer. Some breast pumps are operated by hand, while others use electricity. Ask your lactation consultant which type will work best for you. Breast pumps can be purchased, but some hospitals and breastfeeding support groups lease breast pumps on a monthly basis. A lactation consultant can teach you how to hand express breast milk, if you prefer not to use a pump.  CARING FOR YOUR BREASTS WHILE YOU BREASTFEED Nipples can become dry, cracked, and sore while breastfeeding. The following recommendations can help keep your breasts moisturized and healthy:  Avoid using soap on your nipples.   Wear a supportive bra. Although not required, special nursing bras and tank tops are designed to allow access to your breasts for breastfeeding without taking off your entire bra or top. Avoid wearing underwire style bras or extremely tight bras.  Air dry your nipples for 3 4minutes after each feeding.   Use only cotton bra pads to absorb leaked breast milk. Leaking of breast milk between feedings is normal.   Use lanolin on your nipples after breastfeeding. Lanolin helps to maintain your skin's normal moisture barrier. If you use pure lanolin you do not need to wash it off before feeding your baby again. Pure lanolin is not toxic to your baby. You may also hand express a  few drops of breast milk and gently massage that milk into your nipples and allow the milk to air dry. In the first few weeks after giving birth, some women experience extremely full breasts (engorgement). Engorgement can make your breasts feel heavy, warm, and tender to the touch. Engorgement peaks within 3 5 days after you give birth. The following recommendations can help ease engorgement:  Completely empty your breasts while breastfeeding or pumping. You may want to start by applying warm, moist heat (in the shower or with warm water-soaked hand towels) just before feeding or pumping. This increases circulation and helps the milk flow. If your baby does not completely empty your breasts while breastfeeding, pump any extra milk after he or she is finished.  Wear a snug bra (nursing or regular) or tank top for 1 2 days to signal your body to slightly decrease milk production.  Apply ice packs to your breasts, unless this is too uncomfortable for you.  Make sure that your baby is latched on and positioned properly while breastfeeding. If engorgement persists after 48 hours of following these recommendations, contact your health care provider or a lactation consultant. OVERALL HEALTH CARE RECOMMENDATIONS WHILE BREASTFEEDING  Eat healthy foods. Alternate between meals and snacks, eating 3 of each per day. Because what you eat affects your breast milk, some of the foods may make your baby more irritable than usual. Avoid eating these foods if you are sure that they are   negatively affecting your baby.  Drink milk, fruit juice, and water to satisfy your thirst (about 10 glasses a day).   Rest often, relax, and continue to take your prenatal vitamins to prevent fatigue, stress, and anemia.  Continue breast self-awareness checks.  Avoid chewing and smoking tobacco.  Avoid alcohol and drug use. Some medicines that may be harmful to your baby can pass through breast milk. It is important to ask your  health care provider before taking any medicine, including all over-the-counter and prescription medicine as well as vitamin and herbal supplements. It is possible to become pregnant while breastfeeding. If birth control is desired, ask your health care provider about options that will be safe for your baby. SEEK MEDICAL CARE IF:   You feel like you want to stop breastfeeding or have become frustrated with breastfeeding.  You have painful breasts or nipples.  Your nipples are cracked or bleeding.  Your breasts are red, tender, or warm.  You have a swollen area on either breast.  You have a fever or chills.  You have nausea or vomiting.  You have drainage other than breast milk from your nipples.  Your breasts do not become full before feedings by the 5th day after you give birth.  You feel sad and depressed.  Your baby is too sleepy to eat well.  Your baby is having trouble sleeping.   Your baby is wetting less than 3 diapers in a 24-hour period.  Your baby has less than 3 stools in a 24-hour period.  Your baby's skin or the white part of his or her eyes becomes yellow.   Your baby is not gaining weight by 5 days of age. SEEK IMMEDIATE MEDICAL CARE IF:   Your baby is overly tired (lethargic) and does not want to wake up and feed.  Your baby develops an unexplained fever. Document Released: 05/18/2005 Document Revised: 01/18/2013 Document Reviewed: 11/09/2012 [employer] Patient Information 2014 [employer].  

## 2013-11-17 ENCOUNTER — Ambulatory Visit (INDEPENDENT_AMBULATORY_CARE_PROVIDER_SITE_OTHER): Payer: BC Managed Care – PPO | Admitting: Obstetrics & Gynecology

## 2013-11-17 VITALS — BP 109/87 | HR 107 | Wt 200.0 lb

## 2013-11-17 DIAGNOSIS — Z3400 Encounter for supervision of normal first pregnancy, unspecified trimester: Secondary | ICD-10-CM

## 2013-11-17 DIAGNOSIS — Z23 Encounter for immunization: Secondary | ICD-10-CM

## 2013-11-17 DIAGNOSIS — Z3403 Encounter for supervision of normal first pregnancy, third trimester: Secondary | ICD-10-CM

## 2013-11-17 LAB — CBC
HCT: 35.1 % — ABNORMAL LOW (ref 36.0–46.0)
Hemoglobin: 12 g/dL (ref 12.0–15.0)
MCH: 29.2 pg (ref 26.0–34.0)
MCHC: 34.2 g/dL (ref 30.0–36.0)
MCV: 85.4 fL (ref 78.0–100.0)
Platelets: 204 10*3/uL (ref 150–400)
RBC: 4.11 MIL/uL (ref 3.87–5.11)
RDW: 13.2 % (ref 11.5–15.5)
WBC: 8 10*3/uL (ref 4.0–10.5)

## 2013-11-17 MED ORDER — TETANUS-DIPHTH-ACELL PERTUSSIS 5-2.5-18.5 LF-MCG/0.5 IM SUSP
0.5000 mL | Freq: Once | INTRAMUSCULAR | Status: AC
  Administered 2013-11-17: 0.5 mL via INTRAMUSCULAR

## 2013-11-17 NOTE — Patient Instructions (Signed)
Return to clinic for any obstetric concerns or go to [facility] for evaluation  

## 2013-11-17 NOTE — Progress Notes (Signed)
Third trimester labs, Tdap today.  No other complaints or concerns.  Fetal movement and labor precautions reviewed.

## 2013-11-18 LAB — HIV ANTIBODY (ROUTINE TESTING W REFLEX): HIV: NONREACTIVE

## 2013-11-18 LAB — RPR

## 2013-11-18 LAB — GLUCOSE TOLERANCE, 1 HOUR (50G) W/O FASTING: Glucose, 1 Hour GTT: 135 mg/dL (ref 70–140)

## 2013-11-27 ENCOUNTER — Other Ambulatory Visit (INDEPENDENT_AMBULATORY_CARE_PROVIDER_SITE_OTHER): Payer: BC Managed Care – PPO | Admitting: *Deleted

## 2013-11-27 DIAGNOSIS — E669 Obesity, unspecified: Secondary | ICD-10-CM

## 2013-11-27 DIAGNOSIS — O99810 Abnormal glucose complicating pregnancy: Secondary | ICD-10-CM

## 2013-11-27 DIAGNOSIS — O99210 Obesity complicating pregnancy, unspecified trimester: Secondary | ICD-10-CM

## 2013-11-27 DIAGNOSIS — R7309 Other abnormal glucose: Secondary | ICD-10-CM

## 2013-11-27 NOTE — Progress Notes (Signed)
Patient is here for three hour glucose tolerance test.

## 2013-11-28 ENCOUNTER — Encounter: Payer: Self-pay | Admitting: Family Medicine

## 2013-11-28 LAB — GLUCOSE TOLERANCE, 3 HOURS
Glucose Tolerance, 1 hour: 133 mg/dL (ref 70–189)
Glucose Tolerance, 2 hour: 95 mg/dL (ref 70–164)
Glucose Tolerance, Fasting: 64 mg/dL — ABNORMAL LOW (ref 70–104)
Glucose, GTT - 3 Hour: 75 mg/dL (ref 70–144)

## 2013-12-04 ENCOUNTER — Encounter (HOSPITAL_COMMUNITY): Payer: Self-pay

## 2013-12-04 ENCOUNTER — Telehealth: Payer: Self-pay | Admitting: *Deleted

## 2013-12-04 ENCOUNTER — Inpatient Hospital Stay (HOSPITAL_COMMUNITY)
Admission: AD | Admit: 2013-12-04 | Discharge: 2013-12-05 | DRG: 781 | Disposition: A | Discharge status: Home / Self Care | Payer: BC Managed Care – PPO | Source: Ambulatory Visit | Attending: Family Medicine | Admitting: Family Medicine

## 2013-12-04 DIAGNOSIS — O4700 False labor before 37 completed weeks of gestation, unspecified trimester: Secondary | ICD-10-CM | POA: Diagnosis present

## 2013-12-04 DIAGNOSIS — N12 Tubulo-interstitial nephritis, not specified as acute or chronic: Secondary | ICD-10-CM | POA: Diagnosis present

## 2013-12-04 DIAGNOSIS — O99210 Obesity complicating pregnancy, unspecified trimester: Secondary | ICD-10-CM

## 2013-12-04 DIAGNOSIS — R109 Unspecified abdominal pain: Secondary | ICD-10-CM | POA: Diagnosis present

## 2013-12-04 DIAGNOSIS — O2300 Infections of kidney in pregnancy, unspecified trimester: Secondary | ICD-10-CM | POA: Diagnosis present

## 2013-12-04 DIAGNOSIS — O2390 Unspecified genitourinary tract infection in pregnancy, unspecified trimester: Principal | ICD-10-CM | POA: Diagnosis present

## 2013-12-04 DIAGNOSIS — O212 Late vomiting of pregnancy: Secondary | ICD-10-CM | POA: Diagnosis present

## 2013-12-04 DIAGNOSIS — Z8249 Family history of ischemic heart disease and other diseases of the circulatory system: Secondary | ICD-10-CM

## 2013-12-04 HISTORY — DX: Urinary tract infection, site not specified: N39.0

## 2013-12-04 LAB — CBC WITH DIFFERENTIAL/PLATELET
Basophils Absolute: 0 10*3/uL (ref 0.0–0.1)
Basophils Relative: 0 % (ref 0–1)
EOS PCT: 0 % (ref 0–5)
Eosinophils Absolute: 0 10*3/uL (ref 0.0–0.7)
HCT: 34.4 % — ABNORMAL LOW (ref 36.0–46.0)
Hemoglobin: 11.9 g/dL — ABNORMAL LOW (ref 12.0–15.0)
LYMPHS ABS: 1.7 10*3/uL (ref 0.7–4.0)
LYMPHS PCT: 11 % — AB (ref 12–46)
MCH: 29.4 pg (ref 26.0–34.0)
MCHC: 34.6 g/dL (ref 30.0–36.0)
MCV: 84.9 fL (ref 78.0–100.0)
Monocytes Absolute: 0.9 10*3/uL (ref 0.1–1.0)
Monocytes Relative: 6 % (ref 3–12)
NEUTROS ABS: 12.8 10*3/uL — AB (ref 1.7–7.7)
Neutrophils Relative %: 83 % — ABNORMAL HIGH (ref 43–77)
PLATELETS: 171 10*3/uL (ref 150–400)
RBC: 4.05 MIL/uL (ref 3.87–5.11)
RDW: 12.8 % (ref 11.5–15.5)
WBC: 15.5 10*3/uL — ABNORMAL HIGH (ref 4.0–10.5)

## 2013-12-04 LAB — URINALYSIS, ROUTINE W REFLEX MICROSCOPIC
BILIRUBIN URINE: NEGATIVE
Glucose, UA: 100 mg/dL — AB
Ketones, ur: NEGATIVE mg/dL
Nitrite: NEGATIVE
Protein, ur: 30 mg/dL — AB
Specific Gravity, Urine: 1.015 (ref 1.005–1.030)
UROBILINOGEN UA: 0.2 mg/dL (ref 0.0–1.0)
pH: 5.5 (ref 5.0–8.0)

## 2013-12-04 LAB — URINE MICROSCOPIC-ADD ON

## 2013-12-04 LAB — TYPE AND SCREEN
ABO/RH(D): A POS
Antibody Screen: NEGATIVE

## 2013-12-04 MED ORDER — ZOLPIDEM TARTRATE 5 MG PO TABS: 5.0000 mg | ORAL_TABLET | Freq: Every evening | ORAL | Status: DC | PRN

## 2013-12-04 MED ORDER — LACTATED RINGERS IV BOLUS (SEPSIS)
1000.0000 mL | Freq: Once | INTRAVENOUS | Status: AC
  Administered 2013-12-04: 1000 mL via INTRAVENOUS

## 2013-12-04 MED ORDER — CALCIUM CARBONATE ANTACID 500 MG PO CHEW: 2.0000 | CHEWABLE_TABLET | ORAL | Status: DC | PRN

## 2013-12-04 MED ORDER — DEXTROSE 5 % IV SOLN
1.0000 g | Freq: Two times a day (BID) | INTRAVENOUS | Status: DC
  Administered 2013-12-04 – 2013-12-05 (×3): 1 g via INTRAVENOUS
  Filled 2013-12-04 (×4): qty 10

## 2013-12-04 MED ORDER — HYDROMORPHONE HCL PF 1 MG/ML IJ SOLN
INTRAMUSCULAR | Status: AC
  Administered 2013-12-04: 1 mg via INTRAVENOUS
  Filled 2013-12-04: qty 1

## 2013-12-04 MED ORDER — PRENATAL MULTIVITAMIN CH
1.0000 | ORAL_TABLET | Freq: Every day | ORAL | Status: DC
  Administered 2013-12-05: 1 via ORAL
  Filled 2013-12-04: qty 1

## 2013-12-04 MED ORDER — HYDROMORPHONE HCL PF 1 MG/ML IJ SOLN
1.0000 mg | INTRAMUSCULAR | Status: DC | PRN
  Administered 2013-12-04 (×3): 1 mg via INTRAVENOUS
  Filled 2013-12-04 (×2): qty 1

## 2013-12-04 MED ORDER — DOCUSATE SODIUM 100 MG PO CAPS
100.0000 mg | ORAL_CAPSULE | Freq: Every day | ORAL | Status: DC
  Administered 2013-12-05: 100 mg via ORAL
  Filled 2013-12-04: qty 1

## 2013-12-04 MED ORDER — ACETAMINOPHEN 325 MG PO TABS: 650.0000 mg | ORAL_TABLET | ORAL | Status: DC | PRN

## 2013-12-04 MED ORDER — SODIUM CHLORIDE 0.9 % IV SOLN
INTRAVENOUS | Status: DC
  Administered 2013-12-05 (×2): via INTRAVENOUS

## 2013-12-04 NOTE — [facility] Note (Signed)
Patient states she started having right flank pain at 1900 last night, went away for a while but returned. Having constant pain, vomiting x 1 this am. Denies bleeding, leaking or abdominal pain. Reports good fetal movement.

## 2013-12-04 NOTE — Telephone Encounter (Signed)
Patient called complaining of severe right flank pain that started at 7 p.m. Yesterday evening.  She has vomited once due to the severity of the pain.  I have advised patient to proceed to [facility] to be evaluated due to the severity of the pain she is having and we do not have a physician in the office until 1:00 this afternoon.  She agrees and will proceed to the hospital.
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
## 2013-12-04 NOTE — MAU Note (Signed)
Per Luster [place]bergen[place], RN charge antenatal, pt to go to room 154 when RN arrives from birthing suites

## 2013-12-04 NOTE — MAU Note (Signed)
Pt states r flank pain began last pm. No bleeding or vag d/c changes. Does note increased frequency with voiding. Rates pain 8/10.

## 2013-12-05 ENCOUNTER — Encounter: Payer: BC Managed Care – PPO | Admitting: Family Medicine

## 2013-12-05 LAB — CBC WITH DIFFERENTIAL/PLATELET
Basophils Absolute: 0 10*3/uL (ref 0.0–0.1)
Basophils Relative: 0 % (ref 0–1)
EOS ABS: 0.1 10*3/uL (ref 0.0–0.7)
EOS PCT: 1 % (ref 0–5)
HEMATOCRIT: 31.4 % — AB (ref 36.0–46.0)
Hemoglobin: 10.6 g/dL — ABNORMAL LOW (ref 12.0–15.0)
LYMPHS ABS: 1.7 10*3/uL (ref 0.7–4.0)
Lymphocytes Relative: 18 % (ref 12–46)
MCH: 29 pg (ref 26.0–34.0)
MCHC: 33.8 g/dL (ref 30.0–36.0)
MCV: 85.8 fL (ref 78.0–100.0)
MONO ABS: 0.9 10*3/uL (ref 0.1–1.0)
MONOS PCT: 9 % (ref 3–12)
Neutro Abs: 7.1 10*3/uL (ref 1.7–7.7)
Neutrophils Relative %: 72 % (ref 43–77)
PLATELETS: 153 10*3/uL (ref 150–400)
RBC: 3.66 MIL/uL — AB (ref 3.87–5.11)
RDW: 12.8 % (ref 11.5–15.5)
WBC: 9.9 10*3/uL (ref 4.0–10.5)

## 2013-12-05 LAB — BASIC METABOLIC PANEL
Anion gap: 11 (ref 5–15)
BUN: 5 mg/dL — AB (ref 6–23)
CO2: 21 mEq/L (ref 19–32)
Calcium: 8.4 mg/dL (ref 8.4–10.5)
Chloride: 106 mEq/L (ref 96–112)
Creatinine, Ser: 0.64 mg/dL (ref 0.50–1.10)
GFR calc Af Amer: >90 mL/min (ref >90)
GFR calc non Af Amer: >90 mL/min (ref >90)
GLUCOSE: 94 mg/dL (ref 70–99)
POTASSIUM: 4 meq/L (ref 3.7–5.3)
Sodium: 138 mEq/L (ref 137–147)

## 2013-12-05 LAB — ABO/RH: ABO/RH(D): A POS

## 2013-12-05 MED ORDER — CEPHALEXIN 500 MG PO CAPS: 500.0000 mg | ORAL_CAPSULE | Freq: Four times a day (QID) | ORAL | Status: DC

## 2013-12-05 NOTE — Discharge Instructions (Signed)
Pyelonephritis, Adult °Pyelonephritis is a kidney infection. In general, there are 2 main types of pyelonephritis: °· Infections that come on quickly without any warning (acute pyelonephritis). °· Infections that persist for a long period of time (chronic pyelonephritis). °CAUSES  °Two main causes of pyelonephritis are: °· Bacteria traveling from the bladder to the kidney. This is a problem especially in pregnant women. The urine in the bladder can become filled with bacteria from multiple causes, including: °¨ Inflammation of the prostate gland (prostatitis). °¨ Sexual intercourse in females. °¨ Bladder infection (cystitis). °· Bacteria traveling from the bloodstream to the tissue part of the kidney. °Problems that may increase your risk of getting a kidney infection include: °· Diabetes. °· Kidney stones or bladder stones. °· Cancer. °· Catheters placed in the bladder. °· Other abnormalities of the kidney or ureter. °SYMPTOMS  °· Abdominal pain. °· Pain in the side or flank area. °· Fever. °· Chills. °· Upset stomach. °· Blood in the urine (dark urine). °· Frequent urination. °· Strong or persistent urge to urinate. °· Burning or stinging when urinating. °DIAGNOSIS  °Your caregiver may diagnose your kidney infection based on your symptoms. A urine sample may also be taken. °TREATMENT  °In general, treatment depends on how severe the infection is.  °· If the infection is mild and caught early, your caregiver may treat you with oral antibiotics and send you home. °· If the infection is more severe, the bacteria may have gotten into the bloodstream. This will require intravenous (IV) antibiotics and a hospital stay. Symptoms may include: °¨ High fever. °¨ Severe flank pain. °¨ Shaking chills. °· Even after a hospital stay, your caregiver may require you to be on oral antibiotics for a period of time. °· Other treatments may be required depending upon the cause of the infection. °HOME CARE INSTRUCTIONS  °· Take your  antibiotics as directed. Finish them even if you start to feel better. °· Make an appointment to have your urine checked to make sure the infection is gone. °· Drink enough fluids to keep your urine clear or pale yellow. °· Take medicines for the bladder if you have urgency and frequency of urination as directed by your caregiver. °SEEK IMMEDIATE MEDICAL CARE IF:  °· You have a fever or persistent symptoms for more than 2-3 days. °· You have a fever and your symptoms suddenly get worse. °· You are unable to take your antibiotics or fluids. °· You develop shaking chills. °· You experience extreme weakness or fainting. °· There is no improvement after 2 days of treatment. °MAKE SURE YOU: °· Understand these instructions. °· Will watch your condition. °· Will get help right away if you are not doing well or get worse. °Document Released: 05/18/2005 Document Revised: 11/17/2011 Document Reviewed: 10/22/2010 °ExitCare® Patient Information ©2015 [employer]. This information is not intended to replace advice given to you by your health care provider. Make sure you discuss any questions you have with your health care provider. ° °

## 2013-12-05 NOTE — H&P (Signed)
Attestation of Attending Supervision of Advanced Practitioner (PA/CNM/NP): Evaluation and management procedures were performed by the Advanced Practitioner under my supervision and collaboration.  I have reviewed the Advanced Practitioner's note and chart, and I agree with the management and plan.  Reva BoresPRATT,Emmalyne Giacomo S, MD [facility] Attending 12/05/2013 8:28 AM

## 2013-12-05 NOTE — Discharge Summary (Signed)
Antenatal Physician Discharge Summary  Patient ID: [name] [name] MRN: [id number] DOB/AGE: 1985/06/13 28 y.o.  Admit date: 12/04/2013 Discharge date: 12/05/2013  Admission Diagnoses: right flank pain  Discharge Diagnoses: same  Prenatal Procedures: NST  Significant Diagnostic Studies:  Results for orders placed during the hospital encounter of 12/04/13 (from the past 168 hour(s))  URINALYSIS, ROUTINE W REFLEX MICROSCOPIC   Collection Time    12/04/13  9:50 AM      Result Value Ref Range   Color, Urine YELLOW  YELLOW   APPearance CLEAR  CLEAR   Specific Gravity, Urine 1.015  1.005 - 1.030   pH 5.5  5.0 - 8.0   Glucose, UA 100 (*) NEGATIVE mg/dL   Hgb urine dipstick LARGE (*) NEGATIVE   Bilirubin Urine NEGATIVE  NEGATIVE   Ketones, ur NEGATIVE  NEGATIVE mg/dL   Protein, ur 30 (*) NEGATIVE mg/dL   Urobilinogen, UA 0.2  0.0 - 1.0 mg/dL   Nitrite NEGATIVE  NEGATIVE   Leukocytes, UA SMALL (*) NEGATIVE  URINE MICROSCOPIC-ADD ON   Collection Time    12/04/13  9:50 AM      Result Value Ref Range   Squamous Epithelial / LPF RARE  RARE   WBC, UA 3-6  <3 WBC/hpf   RBC / HPF 0-2  <3 RBC/hpf   Bacteria, UA RARE  RARE  CBC WITH DIFFERENTIAL   Collection Time    12/04/13 10:50 AM      Result Value Ref Range   WBC 15.5 (*) 4.0 - 10.5 K/uL   RBC 4.05  3.87 - 5.11 MIL/uL   Hemoglobin 11.9 (*) 12.0 - 15.0 g/dL   HCT 40.934.4 (*) 81.136.0 - 91.446.0 %   MCV 84.9  78.0 - 100.0 fL   MCH 29.4  26.0 - 34.0 pg   MCHC 34.6  30.0 - 36.0 g/dL   RDW 78.212.8  95.611.5 - 21.315.5 %   Platelets 171  150 - 400 K/uL   Neutrophils Relative % 83 (*) 43 - 77 %   Neutro Abs 12.8 (*) 1.7 - 7.7 K/uL   Lymphocytes Relative 11 (*) 12 - 46 %   Lymphs Abs 1.7  0.7 - 4.0 K/uL   Monocytes Relative 6  3 - 12 %   Monocytes Absolute 0.9  0.1 - 1.0 K/uL   Eosinophils Relative 0  0 - 5 %   Eosinophils Absolute 0.0  0.0 - 0.7 K/uL   Basophils Relative 0  0 - 1 %   Basophils Absolute 0.0  0.0 - 0.1 K/uL  TYPE AND SCREEN   Collection  Time    12/04/13  7:50 PM      Result Value Ref Range   ABO/RH(D) A POS     Antibody Screen NEG     Sample Expiration 12/07/2013    ABO/RH   Collection Time    12/04/13  7:55 PM      Result Value Ref Range   ABO/RH(D) A POS    BASIC METABOLIC PANEL   Collection Time    12/05/13  5:22 AM      Result Value Ref Range   Sodium 138  137 - 147 mEq/L   Potassium 4.0  3.7 - 5.3 mEq/L   Chloride 106  96 - 112 mEq/L   CO2 21  19 - 32 mEq/L   Glucose, Bld 94  70 - 99 mg/dL   BUN 5 (*) 6 - 23 mg/dL   Creatinine, Ser 0.860.64  0.50 - 1.10  mg/dL   Calcium 8.4  8.4 - 82.910.5 mg/dL   GFR calc non Af Amer >90  >90 mL/min   GFR calc Af Amer >90  >90 mL/min   Anion gap 11  5 - 15  CBC WITH DIFFERENTIAL   Collection Time    12/05/13  5:22 AM      Result Value Ref Range   WBC 9.9  4.0 - 10.5 K/uL   RBC 3.66 (*) 3.87 - 5.11 MIL/uL   Hemoglobin 10.6 (*) 12.0 - 15.0 g/dL   HCT 56.231.4 (*) 13.036.0 - 86.546.0 %   MCV 85.8  78.0 - 100.0 fL   MCH 29.0  26.0 - 34.0 pg   MCHC 33.8  30.0 - 36.0 g/dL   RDW 78.412.8  69.611.5 - 29.515.5 %   Platelets 153  150 - 400 K/uL   Neutrophils Relative % 72  43 - 77 %   Neutro Abs 7.1  1.7 - 7.7 K/uL   Lymphocytes Relative 18  12 - 46 %   Lymphs Abs 1.7  0.7 - 4.0 K/uL   Monocytes Relative 9  3 - 12 %   Monocytes Absolute 0.9  0.1 - 1.0 K/uL   Eosinophils Relative 1  0 - 5 %   Eosinophils Absolute 0.1  0.0 - 0.7 K/uL   Basophils Relative 0  0 - 1 %   Basophils Absolute 0.0  0.0 - 0.1 K/uL    Treatments: IV hydration, antibiotics: ceftriaxone and analgesia: Dilaudid  Hospital Course:  This is a 28 y.o. G1P0000 with IUP at 5391w4d admitted for right flank pain. She also c/o significant N/V at the time of admission. No leaking of fluid and no bleeding.  She was initially started on Ceftriaxone for pyelonephritis although a renal stone could not be ruled out.  Over night she received ceftriaxone IV.  She sx were almost comletely resolved this am with limited pain medications. Her last pain  medication was last night at 6pm.  Her urine culture was pending but she is afebrile and has no additional N/V. She was observed, fetal heart rate monitoring remained reassuring, and she had no signs/symptoms of  preterm labor or other maternal-fetal concerns. She was deemed stable for discharge to home with outpatient follow up.  Discharge Exam: BP 116/77  Pulse 97  Temp(Src) 98.3 F (36.8 C) (Oral)  Resp 20  Ht 5\' 3"  (1.6 m)  Wt 202 lb 3.2 oz (91.717 kg)  BMI 35.83 kg/m2  SpO2 99%  LMP 05/05/2013 General appearance: alert and no distress Resp: clear to auscultation bilaterally Cardio: regular rate and rhythm, S1, S2 normal, no murmur, click, rub or gallop GI: soft, non-tender; bowel sounds normal; no masses,  no organomegaly and gravid; no flank tenderness on either side Extremities: extremities normal, atraumatic, no cyanosis or edema  Discharge Condition: good  Disposition: Final discharge disposition not confirmed  Discharge Instructions   Discharge activity:  No Restrictions    Complete by:  As directed      Discharge diet:  No restrictions    Complete by:  As directed      No sexual activity restrictions    Complete by:  As directed      Notify physician for a general feeling that "something is not right"    Complete by:  As directed      Notify physician for increase or change in vaginal discharge    Complete by:  As directed      Notify  physician for intestinal cramps, with or without diarrhea, sometimes described as "gas pain"    Complete by:  As directed      Notify physician for leaking of fluid    Complete by:  As directed      Notify physician for low, dull backache, unrelieved by heat or Tylenol    Complete by:  As directed      Notify physician for menstrual like cramps    Complete by:  As directed      Notify physician for pelvic pressure    Complete by:  As directed      Notify physician for uterine contractions.  These may be painless and feel like the  uterus is tightening or the baby is  "balling up"    Complete by:  As directed      Notify physician for vaginal bleeding    Complete by:  As directed      PRETERM LABOR:  Includes any of the follwing symptoms that occur between 20 - [redacted] weeks gestation.  If these symptoms are not stopped, preterm labor can result in preterm delivery, placing your baby at risk    Complete by:  As directed             Medication List         acetaminophen 500 MG tablet  Commonly known as:  TYLENOL  Take 1,000 mg by mouth every 6 (six) hours as needed for mild pain.     calcium carbonate 500 MG chewable tablet  Commonly known as:  TUMS - dosed in mg elemental calcium  Chew 2 tablets by mouth 2 (two) times daily as needed for indigestion or heartburn.     cephALEXin 500 MG capsule  Commonly known as:  KEFLEX  Take 1 capsule (500 mg total) by mouth 4 (four) times daily.     PRENATAL VITAMINS PO  Take 1 tablet by mouth daily.           Follow-up Information   Follow up with [facility] In 2 weeks.   Specialty:  Obstetrics and Gynecology   Contact information:   [address] [place] [address] [phone]      Signed: CATHARINE, KETTLEWELL M.D. 12/05/2013, 9:45 AM

## 2013-12-05 NOTE — Progress Notes (Signed)
UR completed 

## 2013-12-06 LAB — URINE CULTURE: Colony Count: 75000

## 2013-12-14 ENCOUNTER — Ambulatory Visit (INDEPENDENT_AMBULATORY_CARE_PROVIDER_SITE_OTHER): Payer: BC Managed Care – PPO | Admitting: Obstetrics & Gynecology

## 2013-12-14 VITALS — BP 120/81 | HR 94 | Wt 203.0 lb

## 2013-12-14 DIAGNOSIS — Z3403 Encounter for supervision of normal first pregnancy, third trimester: Secondary | ICD-10-CM

## 2013-12-14 DIAGNOSIS — Z3400 Encounter for supervision of normal first pregnancy, unspecified trimester: Secondary | ICD-10-CM

## 2013-12-14 NOTE — Progress Notes (Signed)
Patient admitted 7/6 - 7/7 for pyelonephritis; had positive urine culture for E.coli.  Send home on Keflex.  No symptoms currently. No other complaints or concerns.  Fetal movement and labor precautions reviewed.

## 2013-12-14 NOTE — Patient Instructions (Signed)
Return to clinic for any obstetric concerns or go to [facility] for evaluation  

## 2013-12-29 ENCOUNTER — Ambulatory Visit (INDEPENDENT_AMBULATORY_CARE_PROVIDER_SITE_OTHER): Payer: BC Managed Care – PPO | Admitting: Obstetrics & Gynecology

## 2013-12-29 ENCOUNTER — Encounter: Payer: Self-pay | Admitting: Obstetrics & Gynecology

## 2013-12-29 VITALS — BP 114/86 | HR 92 | Wt 206.0 lb

## 2013-12-29 DIAGNOSIS — Z3400 Encounter for supervision of normal first pregnancy, unspecified trimester: Secondary | ICD-10-CM

## 2013-12-29 DIAGNOSIS — Z3403 Encounter for supervision of normal first pregnancy, third trimester: Secondary | ICD-10-CM

## 2013-12-29 NOTE — Progress Notes (Signed)
Routine visit. Good FM. No problems. She is now drinking about 80 ounces of water per day. No CVAT on exam today. She will get cultures in 2 weeks with her ROB. RTC prn sooner

## 2014-01-12 ENCOUNTER — Encounter: Payer: Self-pay | Admitting: Obstetrics & Gynecology

## 2014-01-12 ENCOUNTER — Ambulatory Visit (INDEPENDENT_AMBULATORY_CARE_PROVIDER_SITE_OTHER): Payer: BC Managed Care – PPO | Admitting: Obstetrics & Gynecology

## 2014-01-12 VITALS — BP 110/80 | HR 88 | Wt 207.0 lb

## 2014-01-12 DIAGNOSIS — Z3403 Encounter for supervision of normal first pregnancy, third trimester: Secondary | ICD-10-CM

## 2014-01-12 DIAGNOSIS — Z3400 Encounter for supervision of normal first pregnancy, unspecified trimester: Secondary | ICD-10-CM

## 2014-01-12 LAB — OB RESULTS CONSOLE GBS: STREP GROUP B AG: NEGATIVE

## 2014-01-12 LAB — OB RESULTS CONSOLE GC/CHLAMYDIA
Chlamydia: NEGATIVE
Gonorrhea: NEGATIVE

## 2014-01-12 NOTE — Progress Notes (Signed)
Routine visit. Good FM. No problems. Cultures today. Labor precautions reviewed.

## 2014-01-13 LAB — GC/CHLAMYDIA PROBE AMP
CT PROBE, AMP APTIMA: NEGATIVE
GC Probe RNA: NEGATIVE

## 2014-01-14 LAB — CULTURE, BETA STREP (GROUP B ONLY)

## 2014-01-17 ENCOUNTER — Encounter: Payer: Self-pay | Admitting: Obstetrics & Gynecology

## 2014-01-18 ENCOUNTER — Encounter: Payer: Self-pay | Admitting: Obstetrics & Gynecology

## 2014-01-18 ENCOUNTER — Ambulatory Visit (INDEPENDENT_AMBULATORY_CARE_PROVIDER_SITE_OTHER): Payer: BC Managed Care – PPO | Admitting: Obstetrics & Gynecology

## 2014-01-18 VITALS — BP 110/75 | HR 88 | Wt 209.0 lb

## 2014-01-18 DIAGNOSIS — Z3400 Encounter for supervision of normal first pregnancy, unspecified trimester: Secondary | ICD-10-CM

## 2014-01-18 DIAGNOSIS — Z3403 Encounter for supervision of normal first pregnancy, third trimester: Secondary | ICD-10-CM

## 2014-01-18 NOTE — Progress Notes (Signed)
Routine visit. Good FM. No problems. Labor precautions reviewed. 

## 2014-01-25 ENCOUNTER — Ambulatory Visit (INDEPENDENT_AMBULATORY_CARE_PROVIDER_SITE_OTHER): Payer: BC Managed Care – PPO | Admitting: Obstetrics and Gynecology

## 2014-01-25 ENCOUNTER — Encounter: Payer: Self-pay | Admitting: Obstetrics and Gynecology

## 2014-01-25 VITALS — BP 117/77 | HR 89 | Wt 210.0 lb

## 2014-01-25 DIAGNOSIS — Z3403 Encounter for supervision of normal first pregnancy, third trimester: Secondary | ICD-10-CM

## 2014-01-25 DIAGNOSIS — O99210 Obesity complicating pregnancy, unspecified trimester: Secondary | ICD-10-CM

## 2014-01-25 DIAGNOSIS — Z3400 Encounter for supervision of normal first pregnancy, unspecified trimester: Secondary | ICD-10-CM

## 2014-01-25 DIAGNOSIS — E669 Obesity, unspecified: Secondary | ICD-10-CM

## 2014-01-25 NOTE — Progress Notes (Signed)
Moderate leuks on urine dip, no UTI symptoms.

## 2014-01-25 NOTE — Progress Notes (Signed)
Patient is doing well without complaints. FM/labor precautions reviewed 

## 2014-02-01 ENCOUNTER — Ambulatory Visit (INDEPENDENT_AMBULATORY_CARE_PROVIDER_SITE_OTHER): Payer: BC Managed Care – PPO | Admitting: Obstetrics & Gynecology

## 2014-02-01 ENCOUNTER — Encounter: Payer: Self-pay | Admitting: Obstetrics & Gynecology

## 2014-02-01 VITALS — BP 118/87 | HR 101 | Wt 211.0 lb

## 2014-02-01 DIAGNOSIS — Z3400 Encounter for supervision of normal first pregnancy, unspecified trimester: Secondary | ICD-10-CM

## 2014-02-01 DIAGNOSIS — Z3403 Encounter for supervision of normal first pregnancy, third trimester: Secondary | ICD-10-CM

## 2014-02-01 NOTE — Progress Notes (Signed)
Routine visit. Good FM. No problems. Labor precautions reveiwed.

## 2014-02-09 ENCOUNTER — Encounter: Payer: Self-pay | Admitting: Obstetrics & Gynecology

## 2014-02-09 ENCOUNTER — Ambulatory Visit (INDEPENDENT_AMBULATORY_CARE_PROVIDER_SITE_OTHER): Payer: BC Managed Care – PPO | Admitting: Obstetrics & Gynecology

## 2014-02-09 VITALS — BP 116/86 | HR 93 | Wt 213.6 lb

## 2014-02-09 DIAGNOSIS — Z23 Encounter for immunization: Secondary | ICD-10-CM | POA: Diagnosis not present

## 2014-02-09 DIAGNOSIS — Z3400 Encounter for supervision of normal first pregnancy, unspecified trimester: Secondary | ICD-10-CM

## 2014-02-09 DIAGNOSIS — O480 Post-term pregnancy: Secondary | ICD-10-CM

## 2014-02-09 DIAGNOSIS — Z3403 Encounter for supervision of normal first pregnancy, third trimester: Secondary | ICD-10-CM

## 2014-02-09 NOTE — Patient Instructions (Signed)
Return to clinic for any obstetric concerns or go to [facility] for evaluation  

## 2014-02-09 NOTE — Progress Notes (Signed)
No cervical change.  Postdates testing to start today with NST.  NST reactive. NST and AFI to be done on 02/13/14; IOL scheduled on 02/16/14 at 7:30 pm.  Flu vaccine given today. No other complaints or concerns.  Labor and fetal movement precautions reviewed.

## 2014-02-13 ENCOUNTER — Encounter: Payer: Self-pay | Admitting: Family Medicine

## 2014-02-13 ENCOUNTER — Ambulatory Visit (INDEPENDENT_AMBULATORY_CARE_PROVIDER_SITE_OTHER): Payer: BC Managed Care – PPO | Admitting: Family Medicine

## 2014-02-13 VITALS — BP 125/91 | HR 98 | Wt 213.6 lb

## 2014-02-13 DIAGNOSIS — O480 Post-term pregnancy: Secondary | ICD-10-CM | POA: Diagnosis not present

## 2014-02-13 DIAGNOSIS — Z3403 Encounter for supervision of normal first pregnancy, third trimester: Secondary | ICD-10-CM

## 2014-02-13 DIAGNOSIS — Z3400 Encounter for supervision of normal first pregnancy, unspecified trimester: Secondary | ICD-10-CM

## 2014-02-13 NOTE — Progress Notes (Signed)
Patient is having increase in headaches.  Some mild cramping, otherwise doing well.

## 2014-02-13 NOTE — Progress Notes (Signed)
NST reviewed and reactive. Scheduled for IOL AFI today

## 2014-02-13 NOTE — Patient Instructions (Signed)
Third Trimester of Pregnancy The third trimester is from week 29 through week 42, months 7 through 9. The third trimester is a time when the fetus is growing rapidly. At the end of the ninth month, the fetus is about 20 inches in length and weighs 6-10 pounds.  BODY CHANGES Your body goes through many changes during pregnancy. The changes vary from woman to woman.   Your weight will continue to increase. You can expect to gain 25-35 pounds (11-16 kg) by the end of the pregnancy.  You may begin to get stretch marks on your hips, abdomen, and breasts.  You may urinate more often because the fetus is moving lower into your pelvis and pressing on your bladder.  You may develop or continue to have heartburn as a result of your pregnancy.  You may develop constipation because certain hormones are causing the muscles that push waste through your intestines to slow down.  You may develop hemorrhoids or swollen, bulging veins (varicose veins).  You may have pelvic pain because of the weight gain and pregnancy hormones relaxing your joints between the bones in your pelvis. Backaches may result from overexertion of the muscles supporting your posture.  You may have changes in your hair. These can include thickening of your hair, rapid growth, and changes in texture. Some women also have hair loss during or after pregnancy, or hair that feels dry or thin. Your hair will most likely return to normal after your baby is born.  Your breasts will continue to grow and be tender. A yellow discharge may leak from your breasts called colostrum.  Your belly button may stick out.  You may feel short of breath because of your expanding uterus.  You may notice the fetus "dropping," or moving lower in your abdomen.  You may have a bloody mucus discharge. This usually occurs a few days to a week before labor begins.  Your cervix becomes thin and soft (effaced) near your due date. WHAT TO EXPECT AT YOUR  PRENATAL EXAMS  You will have prenatal exams every 2 weeks until week 36. Then, you will have weekly prenatal exams. During a routine prenatal visit:  You will be weighed to make sure you and the fetus are growing normally.  Your blood pressure is taken.  Your abdomen will be measured to track your baby's growth.  The fetal heartbeat will be listened to.  Any test results from the previous visit will be discussed.  You may have a cervical check near your due date to see if you have effaced. At around 36 weeks, your caregiver will check your cervix. At the same time, your caregiver will also perform a test on the secretions of the vaginal tissue. This test is to determine if a type of bacteria, Group B streptococcus, is present. Your caregiver will explain this further. Your caregiver may ask you:  What your birth plan is.  How you are feeling.  If you are feeling the baby move.  If you have had any abnormal symptoms, such as leaking fluid, bleeding, severe headaches, or abdominal cramping.  If you have any questions. Other tests or screenings that may be performed during your third trimester include:  Blood tests that check for low iron levels (anemia).  Fetal testing to check the health, activity level, and growth of the fetus. Testing is done if you have certain medical conditions or if there are problems during the pregnancy. FALSE LABOR You may feel small, irregular contractions that   eventually go away. These are called Braxton Hicks contractions, or false labor. Contractions may last for hours, days, or even weeks before true labor sets in. If contractions come at regular intervals, intensify, or become painful, it is best to be seen by your caregiver.  SIGNS OF LABOR   Menstrual-like cramps.  Contractions that are 5 minutes apart or less.  Contractions that start on the top of the uterus and spread down to the lower abdomen and back.  A sense of increased pelvic  pressure or back pain.  A watery or bloody mucus discharge that comes from the vagina. If you have any of these signs before the 37th week of pregnancy, call your caregiver right away. You need to go to the hospital to get checked immediately. HOME CARE INSTRUCTIONS   Avoid all smoking, herbs, alcohol, and unprescribed drugs. These chemicals affect the formation and growth of the baby.  Follow your caregiver's instructions regarding medicine use. There are medicines that are either safe or unsafe to take during pregnancy.  Exercise only as directed by your caregiver. Experiencing uterine cramps is a good sign to stop exercising.  Continue to eat regular, healthy meals.  Wear a good support bra for breast tenderness.  Do not use hot tubs, steam rooms, or saunas.  Wear your seat belt at all times when driving.  Avoid raw meat, uncooked cheese, cat litter boxes, and soil used by cats. These carry germs that can cause birth defects in the baby.  Take your prenatal vitamins.  Try taking a stool softener (if your caregiver approves) if you develop constipation. Eat more high-fiber foods, such as fresh vegetables or fruit and whole grains. Drink plenty of fluids to keep your urine clear or pale yellow.  Take warm sitz baths to soothe any pain or discomfort caused by hemorrhoids. Use hemorrhoid cream if your caregiver approves.  If you develop varicose veins, wear support hose. Elevate your feet for 15 minutes, 3-4 times a day. Limit salt in your diet.  Avoid heavy lifting, wear low heal shoes, and practice good posture.  Rest a lot with your legs elevated if you have leg cramps or low back pain.  Visit your dentist if you have not gone during your pregnancy. Use a soft toothbrush to brush your teeth and be gentle when you floss.  A sexual relationship may be continued unless your caregiver directs you otherwise.  Do not travel far distances unless it is absolutely necessary and only  with the approval of your caregiver.  Take prenatal classes to understand, practice, and ask questions about the labor and delivery.  Make a trial run to the hospital.  Pack your hospital bag.  Prepare the baby's nursery.  Continue to go to all your prenatal visits as directed by your caregiver. SEEK MEDICAL CARE IF:  You are unsure if you are in labor or if your water has broken.  You have dizziness.  You have mild pelvic cramps, pelvic pressure, or nagging pain in your abdominal area.  You have persistent nausea, vomiting, or diarrhea.  You have a bad smelling vaginal discharge.  You have pain with urination. SEEK IMMEDIATE MEDICAL CARE IF:   You have a fever.  You are leaking fluid from your vagina.  You have spotting or bleeding from your vagina.  You have severe abdominal cramping or pain.  You have rapid weight loss or gain.  You have shortness of breath with chest pain.  You notice sudden or extreme swelling   of your face, hands, ankles, feet, or legs.  You have not felt your baby move in over an hour.  You have severe headaches that do not go away with medicine.  You have vision changes. Document Released: 05/12/2001 Document Revised: 05/23/2013 Document Reviewed: 07/19/2012 [employer] Patient Information 2015 [employer]. This information is not intended to replace advice given to you by your health care provider. Make sure you discuss any questions you have with your health care provider.  Breastfeeding Deciding to breastfeed is one of the best choices you can make for you and your baby. A change in hormones during pregnancy causes your breast tissue to grow and increases the number and size of your milk ducts. These hormones also allow proteins, sugars, and fats from your blood supply to make breast milk in your milk-producing glands. Hormones prevent breast milk from being released before your baby is born as well as prompt milk flow after birth. Once  breastfeeding has begun, thoughts of your baby, as well as his or her sucking or crying, can stimulate the release of milk from your milk-producing glands.  BENEFITS OF BREASTFEEDING For Your Baby  Your first milk (colostrum) helps your baby's digestive system function better.   There are antibodies in your milk that help your baby fight off infections.   Your baby has a lower incidence of asthma, allergies, and sudden infant death syndrome.   The nutrients in breast milk are better for your baby than infant formulas and are designed uniquely for your baby's needs.   Breast milk improves your baby's brain development.   Your baby is less likely to develop other conditions, such as childhood obesity, asthma, or type 2 diabetes mellitus.  For You   Breastfeeding helps to create a very special bond between you and your baby.   Breastfeeding is convenient. Breast milk is always available at the correct temperature and costs nothing.   Breastfeeding helps to burn calories and helps you lose the weight gained during pregnancy.   Breastfeeding makes your uterus contract to its prepregnancy size faster and slows bleeding (lochia) after you give birth.   Breastfeeding helps to lower your risk of developing type 2 diabetes mellitus, osteoporosis, and breast or ovarian cancer later in life. SIGNS THAT YOUR BABY IS HUNGRY Early Signs of Hunger  Increased alertness or activity.  Stretching.  Movement of the head from side to side.  Movement of the head and opening of the mouth when the corner of the mouth or cheek is stroked (rooting).  Increased sucking sounds, smacking lips, cooing, sighing, or squeaking.  Hand-to-mouth movements.  Increased sucking of fingers or hands. Late Signs of Hunger  Fussing.  Intermittent crying. Extreme Signs of Hunger Signs of extreme hunger will require calming and consoling before your baby will be able to breastfeed successfully. Do not  wait for the following signs of extreme hunger to occur before you initiate breastfeeding:   Restlessness.  A loud, strong cry.   Screaming. BREASTFEEDING BASICS Breastfeeding Initiation  Find a comfortable place to sit or lie down, with your neck and back well supported.  Place a pillow or rolled up blanket under your baby to bring him or her to the level of your breast (if you are seated). Nursing pillows are specially designed to help support your arms and your baby while you breastfeed.  Make sure that your baby's abdomen is facing your abdomen.   Gently massage your breast. With your fingertips, massage from your chest   wall toward your nipple in a circular motion. This encourages milk flow. You may need to continue this action during the feeding if your milk flows slowly.  Support your breast with 4 fingers underneath and your thumb above your nipple. Make sure your fingers are well away from your nipple and your baby's mouth.   Stroke your baby's lips gently with your finger or nipple.   When your baby's mouth is open wide enough, quickly bring your baby to your breast, placing your entire nipple and as much of the colored area around your nipple (areola) as possible into your baby's mouth.   More areola should be visible above your baby's upper lip than below the lower lip.   Your baby's tongue should be between his or her lower gum and your breast.   Ensure that your baby's mouth is correctly positioned around your nipple (latched). Your baby's lips should create a seal on your breast and be turned out (everted).  It is common for your baby to suck about 2-3 minutes in order to start the flow of breast milk. Latching Teaching your baby how to latch on to your breast properly is very important. An improper latch can cause nipple pain and decreased milk supply for you and poor weight gain in your baby. Also, if your baby is not latched onto your nipple properly, he or she  may swallow some air during feeding. This can make your baby fussy. Burping your baby when you switch breasts during the feeding can help to get rid of the air. However, teaching your baby to latch on properly is still the best way to prevent fussiness from swallowing air while breastfeeding. Signs that your baby has successfully latched on to your nipple:    Silent tugging or silent sucking, without causing you pain.   Swallowing heard between every 3-4 sucks.    Muscle movement above and in front of his or her ears while sucking.  Signs that your baby has not successfully latched on to nipple:   Sucking sounds or smacking sounds from your baby while breastfeeding.  Nipple pain. If you think your baby has not latched on correctly, slip your finger into the corner of your baby's mouth to break the suction and place it between your baby's gums. Attempt breastfeeding initiation again. Signs of Successful Breastfeeding Signs from your baby:   A gradual decrease in the number of sucks or complete cessation of sucking.   Falling asleep.   Relaxation of his or her body.   Retention of a small amount of milk in his or her mouth.   Letting go of your breast by himself or herself. Signs from you:  Breasts that have increased in firmness, weight, and size 1-3 hours after feeding.   Breasts that are softer immediately after breastfeeding.  Increased milk volume, as well as a change in milk consistency and color by the fifth day of breastfeeding.   Nipples that are not sore, cracked, or bleeding. Signs That Your Baby is Getting Enough Milk  Wetting at least 3 diapers in a 24-hour period. The urine should be clear and pale yellow by age 5 days.  At least 3 stools in a 24-hour period by age 5 days. The stool should be soft and yellow.  At least 3 stools in a 24-hour period by age 7 days. The stool should be seedy and yellow.  No loss of weight greater than 10% of birth weight  during the first 3   days of age.  Average weight gain of 4-7 ounces (113-198 g) per week after age 4 days.  Consistent daily weight gain by age 5 days, without weight loss after the age of 2 weeks. After a feeding, your baby may spit up a small amount. This is common. BREASTFEEDING FREQUENCY AND DURATION Frequent feeding will help you make more milk and can prevent sore nipples and breast engorgement. Breastfeed when you feel the need to reduce the fullness of your breasts or when your baby shows signs of hunger. This is called "breastfeeding on demand." Avoid introducing a pacifier to your baby while you are working to establish breastfeeding (the first 4-6 weeks after your baby is born). After this time you may choose to use a pacifier. Research has shown that pacifier use during the first year of a baby's life decreases the risk of sudden infant death syndrome (SIDS). Allow your baby to feed on each breast as long as he or she wants. Breastfeed until your baby is finished feeding. When your baby unlatches or falls asleep while feeding from the first breast, offer the second breast. Because newborns are often sleepy in the first few weeks of life, you may need to awaken your baby to get him or her to feed. Breastfeeding times will vary from baby to baby. However, the following rules can serve as a guide to help you ensure that your baby is properly fed:  Newborns (babies 4 weeks of age or younger) may breastfeed every 1-3 hours.  Newborns should not go longer than 3 hours during the day or 5 hours during the night without breastfeeding.  You should breastfeed your baby a minimum of 8 times in a 24-hour period until you begin to introduce solid foods to your baby at around 6 months of age. BREAST MILK PUMPING Pumping and storing breast milk allows you to ensure that your baby is exclusively fed your breast milk, even at times when you are unable to breastfeed. This is especially important if you are  going back to work while you are still breastfeeding or when you are not able to be present during feedings. Your lactation consultant can give you guidelines on how long it is safe to store breast milk.  A breast pump is a machine that allows you to pump milk from your breast into a sterile bottle. The pumped breast milk can then be stored in a refrigerator or freezer. Some breast pumps are operated by hand, while others use electricity. Ask your lactation consultant which type will work best for you. Breast pumps can be purchased, but some hospitals and breastfeeding support groups lease breast pumps on a monthly basis. A lactation consultant can teach you how to hand express breast milk, if you prefer not to use a pump.  CARING FOR YOUR BREASTS WHILE YOU BREASTFEED Nipples can become dry, cracked, and sore while breastfeeding. The following recommendations can help keep your breasts moisturized and healthy:  Avoid using soap on your nipples.   Wear a supportive bra. Although not required, special nursing bras and tank tops are designed to allow access to your breasts for breastfeeding without taking off your entire bra or top. Avoid wearing underwire-style bras or extremely tight bras.  Air dry your nipples for 3-4minutes after each feeding.   Use only cotton bra pads to absorb leaked breast milk. Leaking of breast milk between feedings is normal.   Use lanolin on your nipples after breastfeeding. Lanolin helps to maintain your skin's   normal moisture barrier. If you use pure lanolin, you do not need to wash it off before feeding your baby again. Pure lanolin is not toxic to your baby. You may also hand express a few drops of breast milk and gently massage that milk into your nipples and allow the milk to air dry. In the first few weeks after giving birth, some women experience extremely full breasts (engorgement). Engorgement can make your breasts feel heavy, warm, and tender to the touch.  Engorgement peaks within 3-5 days after you give birth. The following recommendations can help ease engorgement:  Completely empty your breasts while breastfeeding or pumping. You may want to start by applying warm, moist heat (in the shower or with warm water-soaked hand towels) just before feeding or pumping. This increases circulation and helps the milk flow. If your baby does not completely empty your breasts while breastfeeding, pump any extra milk after he or she is finished.  Wear a snug bra (nursing or regular) or tank top for 1-2 days to signal your body to slightly decrease milk production.  Apply ice packs to your breasts, unless this is too uncomfortable for you.  Make sure that your baby is latched on and positioned properly while breastfeeding. If engorgement persists after 48 hours of following these recommendations, contact your health care provider or a lactation consultant. OVERALL HEALTH CARE RECOMMENDATIONS WHILE BREASTFEEDING  Eat healthy foods. Alternate between meals and snacks, eating 3 of each per day. Because what you eat affects your breast milk, some of the foods may make your baby more irritable than usual. Avoid eating these foods if you are sure that they are negatively affecting your baby.  Drink milk, fruit juice, and water to satisfy your thirst (about 10 glasses a day).   Rest often, relax, and continue to take your prenatal vitamins to prevent fatigue, stress, and anemia.  Continue breast self-awareness checks.  Avoid chewing and smoking tobacco.  Avoid alcohol and drug use. Some medicines that may be harmful to your baby can pass through breast milk. It is important to ask your health care provider before taking any medicine, including all over-the-counter and prescription medicine as well as vitamin and herbal supplements. It is possible to become pregnant while breastfeeding. If birth control is desired, ask your health care provider about options that  will be safe for your baby. SEEK MEDICAL CARE IF:   You feel like you want to stop breastfeeding or have become frustrated with breastfeeding.  You have painful breasts or nipples.  Your nipples are cracked or bleeding.  Your breasts are red, tender, or warm.  You have a swollen area on either breast.  You have a fever or chills.  You have nausea or vomiting.  You have drainage other than breast milk from your nipples.  Your breasts do not become full before feedings by the fifth day after you give birth.  You feel sad and depressed.  Your baby is too sleepy to eat well.  Your baby is having trouble sleeping.   Your baby is wetting less than 3 diapers in a 24-hour period.  Your baby has less than 3 stools in a 24-hour period.  Your baby's skin or the white part of his or her eyes becomes yellow.   Your baby is not gaining weight by 5 days of age. SEEK IMMEDIATE MEDICAL CARE IF:   Your baby is overly tired (lethargic) and does not want to wake up and feed.  Your baby   develops an unexplained fever. Document Released: 05/18/2005 Document Revised: 05/23/2013 Document Reviewed: 11/09/2012 [employer] Patient Information 2015 [employer]. This information is not intended to replace advice given to you by your health care provider. Make sure you discuss any questions you have with your health care provider.  

## 2014-02-14 ENCOUNTER — Telehealth (HOSPITAL_COMMUNITY): Payer: Self-pay | Admitting: *Deleted

## 2014-02-14 ENCOUNTER — Encounter (HOSPITAL_COMMUNITY): Payer: Self-pay | Admitting: *Deleted

## 2014-02-14 NOTE — Telephone Encounter (Signed)
Preadmission screen  

## 2014-02-16 ENCOUNTER — Encounter (HOSPITAL_COMMUNITY): Payer: Self-pay

## 2014-02-16 ENCOUNTER — Inpatient Hospital Stay (HOSPITAL_COMMUNITY)
Admission: RE | Admit: 2014-02-16 | Discharge: 2014-02-20 | DRG: 775 | Disposition: A | Discharge status: Home / Self Care | Payer: BC Managed Care – PPO | Source: Ambulatory Visit | Attending: Obstetrics and Gynecology | Admitting: Obstetrics and Gynecology

## 2014-02-16 VITALS — BP 114/79 | HR 76 | Temp 97.8°F | Resp 18 | Ht 62.0 in | Wt 213.0 lb

## 2014-02-16 DIAGNOSIS — O99210 Obesity complicating pregnancy, unspecified trimester: Secondary | ICD-10-CM

## 2014-02-16 DIAGNOSIS — Z8249 Family history of ischemic heart disease and other diseases of the circulatory system: Secondary | ICD-10-CM | POA: Diagnosis not present

## 2014-02-16 DIAGNOSIS — O99214 Obesity complicating childbirth: Secondary | ICD-10-CM | POA: Diagnosis present

## 2014-02-16 DIAGNOSIS — Z825 Family history of asthma and other chronic lower respiratory diseases: Secondary | ICD-10-CM | POA: Diagnosis not present

## 2014-02-16 DIAGNOSIS — Z6839 Body mass index (BMI) 39.0-39.9, adult: Secondary | ICD-10-CM | POA: Diagnosis not present

## 2014-02-16 DIAGNOSIS — O480 Post-term pregnancy: Secondary | ICD-10-CM | POA: Diagnosis present

## 2014-02-16 DIAGNOSIS — E669 Obesity, unspecified: Secondary | ICD-10-CM | POA: Diagnosis present

## 2014-02-16 DIAGNOSIS — Z3490 Encounter for supervision of normal pregnancy, unspecified, unspecified trimester: Secondary | ICD-10-CM

## 2014-02-16 LAB — CBC
HCT: 35.8 % — ABNORMAL LOW (ref 36.0–46.0)
HEMOGLOBIN: 12.2 g/dL (ref 12.0–15.0)
MCH: 29.2 pg (ref 26.0–34.0)
MCHC: 34.1 g/dL (ref 30.0–36.0)
MCV: 85.6 fL (ref 78.0–100.0)
Platelets: 183 10*3/uL (ref 150–400)
RBC: 4.18 MIL/uL (ref 3.87–5.11)
RDW: 13.4 % (ref 11.5–15.5)
WBC: 10.2 10*3/uL (ref 4.0–10.5)

## 2014-02-16 MED ORDER — MISOPROSTOL 25 MCG QUARTER TABLET
25.0000 ug | ORAL_TABLET | ORAL | Status: DC
  Administered 2014-02-16 – 2014-02-17 (×4): 25 ug via VAGINAL
  Filled 2014-02-16 (×4): qty 0.25

## 2014-02-16 MED ORDER — LACTATED RINGERS IV SOLN
INTRAVENOUS | Status: DC
  Administered 2014-02-16 – 2014-02-18 (×5): via INTRAVENOUS

## 2014-02-16 MED ORDER — TERBUTALINE SULFATE 1 MG/ML IJ SOLN: 0.2500 mg | Freq: Once | INTRAMUSCULAR | Status: AC | PRN

## 2014-02-16 MED ORDER — OXYTOCIN BOLUS FROM INFUSION: 500.0000 mL | INTRAVENOUS | Status: DC

## 2014-02-16 MED ORDER — TERBUTALINE SULFATE 1 MG/ML IJ SOLN
0.2500 mg | Freq: Once | INTRAMUSCULAR | Status: AC | PRN
Start: 2014-02-16 — End: 2014-02-16

## 2014-02-16 MED ORDER — CITRIC ACID-SODIUM CITRATE 334-500 MG/5ML PO SOLN: 30.0000 mL | ORAL | Status: DC | PRN

## 2014-02-16 MED ORDER — FLEET ENEMA 7-19 GM/118ML RE ENEM: 1.0000 | ENEMA | RECTAL | Status: DC | PRN

## 2014-02-16 MED ORDER — LACTATED RINGERS IV SOLN
500.0000 mL | INTRAVENOUS | Status: DC | PRN
  Administered 2014-02-18: 500 mL via INTRAVENOUS

## 2014-02-16 MED ORDER — ZOLPIDEM TARTRATE 5 MG PO TABS: 5.0000 mg | ORAL_TABLET | Freq: Every evening | ORAL | Status: DC | PRN

## 2014-02-16 MED ORDER — OXYCODONE-ACETAMINOPHEN 5-325 MG PO TABS: 2.0000 | ORAL_TABLET | ORAL | Status: DC | PRN

## 2014-02-16 MED ORDER — ACETAMINOPHEN 325 MG PO TABS: 650.0000 mg | ORAL_TABLET | ORAL | Status: DC | PRN

## 2014-02-16 MED ORDER — OXYCODONE-ACETAMINOPHEN 5-325 MG PO TABS: 1.0000 | ORAL_TABLET | ORAL | Status: DC | PRN

## 2014-02-16 MED ORDER — LIDOCAINE HCL (PF) 1 % IJ SOLN
30.0000 mL | INTRAMUSCULAR | Status: DC | PRN
  Filled 2014-02-16: qty 30

## 2014-02-16 MED ORDER — ONDANSETRON HCL 4 MG/2ML IJ SOLN
4.0000 mg | Freq: Four times a day (QID) | INTRAMUSCULAR | Status: DC | PRN
  Administered 2014-02-18: 4 mg via INTRAVENOUS
  Filled 2014-02-16: qty 2

## 2014-02-16 MED ORDER — OXYTOCIN 40 UNITS IN LACTATED RINGERS INFUSION - SIMPLE MED
62.5000 mL/h | INTRAVENOUS | Status: DC
  Administered 2014-02-18: 62.5 mL/h via INTRAVENOUS

## 2014-02-16 NOTE — Plan of Care (Signed)
Problem: Consults Goal: Birthing Suites Patient Information Press F2 to bring up selections list  Pt > [redacted] weeks EGA and Inpatient induction        
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
## 2014-02-16 NOTE — Plan of Care (Signed)
Problem: Consults Goal: Birthing Suites Patient Information Press F2 to bring up selections list  Pt > [redacted] weeks EGA and Inpatient induction

## 2014-02-17 ENCOUNTER — Encounter (HOSPITAL_COMMUNITY): Payer: BC Managed Care – PPO | Admitting: Anesthesiology

## 2014-02-17 ENCOUNTER — Inpatient Hospital Stay (HOSPITAL_COMMUNITY): Payer: BC Managed Care – PPO | Admitting: Anesthesiology

## 2014-02-17 LAB — CBC
HCT: 35.3 % — ABNORMAL LOW (ref 36.0–46.0)
Hemoglobin: 12.3 g/dL (ref 12.0–15.0)
MCH: 29.5 pg (ref 26.0–34.0)
MCHC: 34.8 g/dL (ref 30.0–36.0)
MCV: 84.7 fL (ref 78.0–100.0)
PLATELETS: 172 10*3/uL (ref 150–400)
RBC: 4.17 MIL/uL (ref 3.87–5.11)
RDW: 13.4 % (ref 11.5–15.5)
WBC: 13.3 10*3/uL — ABNORMAL HIGH (ref 4.0–10.5)

## 2014-02-17 LAB — RPR

## 2014-02-17 MED ORDER — DIPHENHYDRAMINE HCL 50 MG/ML IJ SOLN: 12.5000 mg | INTRAMUSCULAR | Status: DC | PRN

## 2014-02-17 MED ORDER — EPHEDRINE 5 MG/ML INJ
10.0000 mg | INTRAVENOUS | Status: DC | PRN
  Filled 2014-02-17: qty 2

## 2014-02-17 MED ORDER — PHENYLEPHRINE 40 MCG/ML (10ML) SYRINGE FOR IV PUSH (FOR BLOOD PRESSURE SUPPORT)
80.0000 ug | PREFILLED_SYRINGE | INTRAVENOUS | Status: DC | PRN
  Filled 2014-02-17: qty 2
  Filled 2014-02-17: qty 10

## 2014-02-17 MED ORDER — PHENYLEPHRINE 40 MCG/ML (10ML) SYRINGE FOR IV PUSH (FOR BLOOD PRESSURE SUPPORT)
80.0000 ug | PREFILLED_SYRINGE | INTRAVENOUS | Status: DC | PRN
  Filled 2014-02-17: qty 2

## 2014-02-17 MED ORDER — OXYTOCIN 40 UNITS IN LACTATED RINGERS INFUSION - SIMPLE MED
1.0000 m[IU]/min | INTRAVENOUS | Status: DC
Start: 2014-02-17 — End: 2014-02-18
  Administered 2014-02-17: 2 m[IU]/min via INTRAVENOUS
  Administered 2014-02-18: 24 m[IU]/min via INTRAVENOUS
  Administered 2014-02-18: 28 m[IU]/min via INTRAVENOUS
  Administered 2014-02-18: 26 m[IU]/min via INTRAVENOUS
  Filled 2014-02-17: qty 1000

## 2014-02-17 MED ORDER — FENTANYL CITRATE 0.05 MG/ML IJ SOLN
100.0000 ug | Freq: Once | INTRAMUSCULAR | Status: AC
  Administered 2014-02-17: 100 ug via INTRAVENOUS
  Filled 2014-02-17: qty 2

## 2014-02-17 MED ORDER — LACTATED RINGERS IV SOLN
500.0000 mL | Freq: Once | INTRAVENOUS | Status: AC
  Administered 2014-02-17: 500 mL via INTRAVENOUS

## 2014-02-17 MED ORDER — FENTANYL 2.5 MCG/ML BUPIVACAINE 1/10 % EPIDURAL INFUSION (WH - ANES)
14.0000 mL/h | INTRAMUSCULAR | Status: DC | PRN
  Administered 2014-02-17 – 2014-02-18 (×4): 14 mL/h via EPIDURAL
  Filled 2014-02-17 (×4): qty 125

## 2014-02-17 MED ORDER — LIDOCAINE HCL (PF) 1 % IJ SOLN
INTRAMUSCULAR | Status: DC | PRN
  Administered 2014-02-17 (×2): 5 mL

## 2014-02-17 MED ORDER — TERBUTALINE SULFATE 1 MG/ML IJ SOLN
0.2500 mg | Freq: Once | INTRAMUSCULAR | Status: AC | PRN
Start: 2014-02-17 — End: 2014-02-17

## 2014-02-17 NOTE — Progress Notes (Signed)
[name] [name] is a 28 y.o. G1P0000 at [redacted]w[redacted]d by  admitted for postdates  Subjective:  Currently comfortable not contracting post foley bulb expelled. Objective: BP 116/73  Pulse 94  Temp(Src) 98 F (36.7 C) (Oral)  Resp 18  Ht  (1.575 m)  Wt 96.616 kg (213 lb)  BMI 38.95 kg/m2  LMP 05/05/2013 I/O last 3 completed shifts: In: 250 [P.O.:250] Out: -     FHT:  FHR: 145 bpm, variability: moderate,  accelerations:  Present,  decelerations:  Absent UC:   none SVE:   Dilation: 4 Effacement (%): 50 Station: -1 Exam by:: [employer], SNMW  Labs: Lab Results  Component Value Date   WBC 10.2 02/16/2014   HGB 12.2 02/16/2014   HCT 35.8* 02/16/2014   MCV 85.6 02/16/2014   PLT 183 02/16/2014    Assessment / Plan: Induction of labor for postdates  Labor: Minimal progress post foley bulb requires Pitocin, plan discussed with patient Preeclampsia:  no signs or symptoms of toxicity Fetal Wellbeing:  Category I Pain Control:  Epidural Planned for I/D:  n/a Anticipated MOD:  NSVD  Lind Covert SNM 02/17/2014, 8:58 PM  Seen also by me Agree with note Aviva Signs, CNM
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
## 2014-02-17 NOTE — Anesthesia Procedure Notes (Signed)
Epidural Patient location during procedure: OB Start time: 02/17/2014 10:34 PM  Staffing Anesthesiologist: Brayton Caves Performed by: anesthesiologist   Preanesthetic Checklist Completed: patient identified, site marked, surgical consent, pre-op evaluation, timeout performed, IV checked, risks and benefits discussed and monitors and equipment checked  Epidural Patient position: sitting Prep: site prepped and draped and DuraPrep Patient monitoring: continuous pulse ox and blood pressure Approach: midline Location: L3-L4 Injection technique: LOR air  Needle:  Needle type: Tuohy  Needle gauge: 17 G Needle length: 9 cm and 9 Needle insertion depth: 6 cm Catheter type: closed end flexible Catheter size: 19 Gauge Catheter at skin depth: 10 cm Test dose: negative  Assessment Events: blood not aspirated, injection not painful, no injection resistance, negative IV test and no paresthesia  Additional Notes Patient identified.  Risk benefits discussed including failed block, incomplete pain control, headache, nerve damage, paralysis, blood pressure changes, nausea, vomiting, reactions to medication both toxic or allergic, and postpartum back pain.  Patient expressed understanding and wished to proceed.  All questions were answered.  Sterile technique used throughout procedure and epidural site dressed with sterile barrier dressing. No paresthesia or other complications noted.The patient did not experience any signs of intravascular injection such as tinnitus or metallic taste in mouth nor signs of intrathecal spread such as rapid motor block. Please see nursing notes for vital signs.

## 2014-02-17 NOTE — Progress Notes (Signed)
Patient ID: [name] [name], female   DOB: 1986-05-27, 28 y.o.   MRN: [id number] [name] [name] is a 28 y.o. G1P0 at [redacted]w[redacted]d admitted for cx ripening/IOL  Subjective: Comfortable but aware of increased UCs. Has had cytotec x5.   Objective: BP 122/76  Pulse 83  Temp(Src) 98.2 F (36.8 C) (Oral)  Resp 18  Ht  (1.575 m)  Wt 96.616 kg (213 lb)  BMI 38.95 kg/m2  LMP 05/05/2013  Fetal Heart FHR: 125 bpm, variability: moderate,  accelerations:  Present,  decelerations:  Absent   Contractions: q 3 min x 40 sec  SVE:   Dilation: 1 Effacement (%): 50 Station: -2 Exam by:: Belenda Cruise RNC  SVE: 1-2/50/-2 Foley Bulb placed  Assessment / Plan:  Labor: Cx more favorable Fetal Wellbeing: Category 1 Pain Control:  n/a Expected mode of delivery: NSVD  [name] [name] 02/17/2014, 3:47 PM

## 2014-02-18 ENCOUNTER — Encounter (HOSPITAL_COMMUNITY): Payer: Self-pay

## 2014-02-18 LAB — TYPE AND SCREEN
ABO/RH(D): A POS
ANTIBODY SCREEN: NEGATIVE
UNIT DIVISION: 0
Unit division: 0

## 2014-02-18 LAB — CBC
HCT: 32.2 % — ABNORMAL LOW (ref 36.0–46.0)
Hemoglobin: 11.1 g/dL — ABNORMAL LOW (ref 12.0–15.0)
MCH: 29.2 pg (ref 26.0–34.0)
MCHC: 34.5 g/dL (ref 30.0–36.0)
MCV: 84.7 fL (ref 78.0–100.0)
PLATELETS: 161 10*3/uL (ref 150–400)
RBC: 3.8 MIL/uL — AB (ref 3.87–5.11)
RDW: 13.1 % (ref 11.5–15.5)
WBC: 18.2 10*3/uL — ABNORMAL HIGH (ref 4.0–10.5)

## 2014-02-18 MED ORDER — MISOPROSTOL 200 MCG PO TABS
1000.0000 ug | ORAL_TABLET | Freq: Once | ORAL | Status: AC
  Administered 2014-02-18: 1000 ug via RECTAL

## 2014-02-18 MED ORDER — METHYLERGONOVINE MALEATE 0.2 MG/ML IJ SOLN
0.2000 mg | Freq: Once | INTRAMUSCULAR | Status: AC
  Administered 2014-02-18: 0.2 mg via INTRAMUSCULAR

## 2014-02-18 MED ORDER — MISOPROSTOL 200 MCG PO TABS
ORAL_TABLET | ORAL | Status: AC
  Administered 2014-02-18: 1000 ug via RECTAL
  Filled 2014-02-18: qty 5

## 2014-02-18 MED ORDER — METHYLERGONOVINE MALEATE 0.2 MG/ML IJ SOLN
INTRAMUSCULAR | Status: AC
  Administered 2014-02-18: 0.2 mg via INTRAMUSCULAR
  Filled 2014-02-18: qty 1

## 2014-02-18 MED ORDER — NALBUPHINE HCL 10 MG/ML IJ SOLN
10.0000 mg | Freq: Once | INTRAMUSCULAR | Status: AC
  Administered 2014-02-18: 10 mg via INTRAMUSCULAR
  Filled 2014-02-18: qty 1

## 2014-02-18 NOTE — Progress Notes (Signed)
[name] [name] is a 28 y.o. G1P0000 at [redacted]w[redacted]d by ultrasound admitted for induction of labor due to Post dates. .  Subjective: Got epidural at 2230.  Now comfortable  Objective: BP 110/65  Pulse 97  Temp(Src) 98.1 F (36.7 C) (Oral)  Resp 16  Ht  (1.575 m)  Wt 213 lb (96.616 kg)  BMI 38.95 kg/m2  SpO2 99%  LMP 05/05/2013 I/O last 3 completed shifts: In: 250 [P.O.:250] Out: -     FHT:  FHR: 140 bpm, variability: moderate,  accelerations:  Present,  decelerations:  Absent UC:   regular, every 2-3 minutes SVE:   Dilation: 4 Effacement (%): 50 Station: -1 Exam by:: [employer], SNMW  Labs: Lab Results  Component Value Date   WBC 13.3* 02/17/2014   HGB 12.3 02/17/2014   HCT 35.3* 02/17/2014   MCV 84.7 02/17/2014   PLT 172 02/17/2014    Assessment / Plan: Induction of labor due to postterm,  progressing well on pitocin  Labor: Progressing normally and On Pitocin Preeclampsia:  n/a Fetal Wellbeing:  Category I Pain Control:  Epidural I/D:  n/a Anticipated MOD:  NSVD  WILLIAMS,MARIE 02/18/2014, 12:28 AM

## 2014-02-18 NOTE — Progress Notes (Signed)
Patient started having ringing in ears while sitting on toilet and feeling light-headed.  Patient immediately transferred to wheelchair and BP taken at 2212.  After transferred to wheelchair patient immediately felt better.

## 2014-02-18 NOTE — H&P (Signed)
Attestation of Attending Supervision of Advanced Practitioner (CNM/NP): Evaluation and management procedures were performed by the Advanced Practitioner under my supervision and collaboration.  I have reviewed the Advanced Practitioner's note and chart, and I agree with the management and plan.  Rorey Hodges 02/18/2014 9:46 AM   

## 2014-02-18 NOTE — Progress Notes (Signed)
Patient ID: [name] [name], female   DOB: 23-Jan-1986, 28 y.o.   MRN: [id number] Doing well.  Comfortable  Filed Vitals:   02/18/14 0231 02/18/14 0301 02/18/14 0331 02/18/14 0401  BP: 121/78 114/77 116/80 121/75  Pulse: 78 87 91 83  Temp:      TempSrc:      Resp: Height:      Weight:      SpO2:       FHR stable with small accels UCs every 2-3 minutes  Dilation: 5 Effacement (%): 80 Cervical Position: Posterior Station: -2 Presentation: Vertex Exam by:: Wynelle Bourgeois, CNM  Plan continue to observe.  Will hold off on AROM for now.   Aviva Signs, CNM

## 2014-02-18 NOTE — Progress Notes (Signed)
Patient ID: [name] [name], female   DOB: [birth year], 28 y.o.   MRN: [id number] Doing well  FHR reactive with average variability, Category I UCs regular every 2 minutes  No exam since 4am  WIll continue to observe for advancement to active phase.

## 2014-02-19 DIAGNOSIS — O480 Post-term pregnancy: Secondary | ICD-10-CM

## 2014-02-19 LAB — CBC
HEMATOCRIT: 28.7 % — AB (ref 36.0–46.0)
Hemoglobin: 10 g/dL — ABNORMAL LOW (ref 12.0–15.0)
MCH: 29.4 pg (ref 26.0–34.0)
MCHC: 34.8 g/dL (ref 30.0–36.0)
MCV: 84.4 fL (ref 78.0–100.0)
PLATELETS: 154 10*3/uL (ref 150–400)
RBC: 3.4 MIL/uL — ABNORMAL LOW (ref 3.87–5.11)
RDW: 13.3 % (ref 11.5–15.5)
WBC: 18 10*3/uL — ABNORMAL HIGH (ref 4.0–10.5)

## 2014-02-19 MED ORDER — OXYTOCIN 40 UNITS IN LACTATED RINGERS INFUSION - SIMPLE MED: 62.5000 mL/h | INTRAVENOUS | Status: DC | PRN

## 2014-02-19 MED ORDER — PRENATAL MULTIVITAMIN CH
1.0000 | ORAL_TABLET | Freq: Every day | ORAL | Status: DC
  Administered 2014-02-19: 1 via ORAL
  Filled 2014-02-19: qty 1

## 2014-02-19 MED ORDER — OXYCODONE-ACETAMINOPHEN 5-325 MG PO TABS: 2.0000 | ORAL_TABLET | ORAL | Status: DC | PRN

## 2014-02-19 MED ORDER — DIPHENHYDRAMINE HCL 25 MG PO CAPS: 25.0000 mg | ORAL_CAPSULE | Freq: Four times a day (QID) | ORAL | Status: DC | PRN

## 2014-02-19 MED ORDER — SODIUM CHLORIDE 0.9 % IJ SOLN: 3.0000 mL | INTRAMUSCULAR | Status: DC | PRN

## 2014-02-19 MED ORDER — ONDANSETRON HCL 4 MG PO TABS: 4.0000 mg | ORAL_TABLET | ORAL | Status: DC | PRN

## 2014-02-19 MED ORDER — ZOLPIDEM TARTRATE 5 MG PO TABS: 5.0000 mg | ORAL_TABLET | Freq: Every evening | ORAL | Status: DC | PRN

## 2014-02-19 MED ORDER — ONDANSETRON HCL 4 MG/2ML IJ SOLN
4.0000 mg | INTRAMUSCULAR | Status: DC | PRN
Start: 2014-02-19 — End: 2014-02-20

## 2014-02-19 MED ORDER — SENNOSIDES-DOCUSATE SODIUM 8.6-50 MG PO TABS
2.0000 | ORAL_TABLET | ORAL | Status: DC
  Administered 2014-02-20: 2 via ORAL
  Filled 2014-02-19: qty 2

## 2014-02-19 MED ORDER — WITCH HAZEL-GLYCERIN EX PADS
1.0000 | MEDICATED_PAD | CUTANEOUS | Status: DC | PRN
Start: 2014-02-19 — End: 2014-02-20

## 2014-02-19 MED ORDER — OXYCODONE-ACETAMINOPHEN 5-325 MG PO TABS: 1.0000 | ORAL_TABLET | ORAL | Status: DC | PRN

## 2014-02-19 MED ORDER — IBUPROFEN 600 MG PO TABS
600.0000 mg | ORAL_TABLET | Freq: Four times a day (QID) | ORAL | Status: DC
  Administered 2014-02-19 – 2014-02-20 (×5): 600 mg via ORAL
  Filled 2014-02-19 (×5): qty 1

## 2014-02-19 MED ORDER — LANOLIN HYDROUS EX OINT: TOPICAL_OINTMENT | CUTANEOUS | Status: DC | PRN

## 2014-02-19 MED ORDER — DIBUCAINE 1 % RE OINT: 1.0000 "application " | TOPICAL_OINTMENT | RECTAL | Status: DC | PRN

## 2014-02-19 MED ORDER — SODIUM CHLORIDE 0.9 % IJ SOLN: 3.0000 mL | Freq: Two times a day (BID) | INTRAMUSCULAR | Status: DC

## 2014-02-19 MED ORDER — SIMETHICONE 80 MG PO CHEW
80.0000 mg | CHEWABLE_TABLET | ORAL | Status: DC | PRN
Start: 2014-02-19 — End: 2014-02-20

## 2014-02-19 MED ORDER — SODIUM CHLORIDE 0.9 % IV SOLN: 250.0000 mL | INTRAVENOUS | Status: DC | PRN

## 2014-02-19 MED ORDER — BENZOCAINE-MENTHOL 20-0.5 % EX AERO
1.0000 "application " | INHALATION_SPRAY | CUTANEOUS | Status: DC | PRN
  Administered 2014-02-19: 1 via TOPICAL
  Filled 2014-02-19: qty 56

## 2014-02-19 MED ORDER — TETANUS-DIPHTH-ACELL PERTUSSIS 5-2.5-18.5 LF-MCG/0.5 IM SUSP: 0.5000 mL | Freq: Once | INTRAMUSCULAR | Status: DC

## 2014-02-19 NOTE — Lactation Note (Signed)
This note was copied from the chart of [name] [name]. Lactation Consultation Note Mom was BF in side lying position. Mom stated the baby had BF for 20 min. Encouraged mom to roll nipples in finger tips to evert nipples more. Breast very compressible. Mom denied pain during feeding. Mom and baby was hot and sweating. Air on 72, cut down to 70. Encouraged mom to drink plenty of fluids. Mom encouraged to feed baby 8-12 times/24 hours and with feeding cues. Mom encouraged to waken baby for feeds.  Educated about newborn behavior. Mom encouraged to do skin-to-skin.Referred to Baby and Me Book in Breastfeeding section Pg. 22-23 for position options and Proper latch demonstration. Encouraged comfort during BF so colostrum flows better and mom will enjoy the feeding longer. Taking deep breaths and breast massage during BF. Hand expression taught to Mom. Noted colostrum. Baby appears well satisfied after feeding.  Patient Name: [name] Griselda Bramblett Today's Date: 02/19/2014 Reason for consult: Initial assessment   Maternal Data Has patient been taught Hand Expression?: Yes Does the patient have breastfeeding experience prior to this delivery?: No  Feeding Feeding Type: Breast Fed Length of feed: 20 min  LATCH Score/Interventions Latch: Repeated attempts needed to sustain latch, nipple held in mouth throughout feeding, stimulation needed to elicit sucking reflex. Intervention(s): Skin to skin;Teach feeding cues;Waking techniques Intervention(s): Adjust position;Assist with latch;Breast massage;Breast compression  Audible Swallowing: A few with stimulation Intervention(s): Skin to skin;Hand expression Intervention(s): Alternate breast massage  Type of Nipple: Everted at rest and after stimulation  Comfort (Breast/Nipple): Soft / non-tender     Hold (Positioning): Assistance needed to correctly position infant at breast and maintain latch. Intervention(s): Breastfeeding basics reviewed;Support  Pillows;Position options;Skin to skin  LATCH Score: 7  Lactation Tools Discussed/Used     Consult Status Consult Status: Follow-up Date: 02/19/14 Follow-up type: In-patient    Thresia Ramanathan, Diamond Nickel 02/19/2014, 5:10 AM

## 2014-02-19 NOTE — Lactation Note (Signed)
This note was copied from the chart of [name] Cai Flott. Lactation Consultation Note  Suggest mother prepump with hand pump to evert nipple more. Assisted in latching baby in cross cradle,  Compressing breast for depth and massaging to keep her active. Provided pillows for support. Observed rhythmical sucks and swallows. Initially baby latched shallow and nipple was compressed.  Discussed this with mother to be aware and encouarge depth. Mom encouraged to feed baby 8-12 times/24 hours and with feeding cues.  Provided comfort gels for soreness and applying ebm.   Patient Name: [name] [name] [id number] Date: 02/19/2014 Reason for consult: Follow-up assessment   Maternal Data    Feeding Feeding Type: Breast Fed  LATCH Score/Interventions Latch: Repeated attempts needed to sustain latch, nipple held in mouth throughout feeding, stimulation needed to elicit sucking reflex. Intervention(s): Skin to skin Intervention(s): Breast compression;Breast massage  Audible Swallowing: Spontaneous and intermittent Intervention(s): Alternate breast massage  Type of Nipple: Everted at rest and after stimulation  Comfort (Breast/Nipple): Filling, red/small blisters or bruises, mild/mod discomfort  Problem noted: Mild/Moderate discomfort Interventions (Mild/moderate discomfort): Comfort gels  Hold (Positioning): Assistance needed to correctly position infant at breast and maintain latch.  LATCH Score: 7  Lactation Tools Discussed/Used     Consult Status Consult Status: Follow-up Date: 02/20/14 Follow-up type: In-patient    Dahlia Byes [facility] 02/19/2014, 12:29 PM

## 2014-02-19 NOTE — Anesthesia Postprocedure Evaluation (Signed)
  Anesthesia Post-op Note  Patient: [name] [name]  Procedure(s) Performed: * No procedures listed *  Patient Location: PACU and Mother/Baby  Anesthesia Type:Epidural  Level of Consciousness: awake, alert  and oriented  Airway and Oxygen Therapy: Patient Spontanous Breathing  Post-op Pain: mild  Post-op Assessment: Patient's Cardiovascular Status Stable, Respiratory Function Stable, No signs of Nausea or vomiting, Adequate PO intake, Pain level controlled, No headache, No backache, No residual numbness and No residual motor weakness  Post-op Vital Signs: Reviewed and stable  Last Vitals:  Filed Vitals:   02/19/14 0413  BP: 109/65  Pulse: 82  Temp: 36.7 C  Resp: 18    Complications: No apparent anesthesia complications

## 2014-02-20 MED ORDER — IBUPROFEN 600 MG PO TABS: 600.0000 mg | ORAL_TABLET | Freq: Four times a day (QID) | ORAL | Status: DC | PRN

## 2014-02-20 NOTE — Discharge Instructions (Signed)

## 2014-02-20 NOTE — Discharge Summary (Signed)
Obstetric Discharge Summary Reason for Admission: onset of labor Prenatal Procedures: none Intrapartum Procedures: vacuum delivery Postpartum Procedures: none Complications-Operative and Postpartum: none Hemoglobin  Date Value Ref Range Status  02/19/2014 10.0* 12.0 - 15.0 g/dL Final     HCT  Date Value Ref Range Status  02/19/2014 28.7* 36.0 - 46.0 % Final    Physical Exam:  General: alert, cooperative and no distress Lochia: appropriate Uterine Fundus: firm Incision: n/a DVT Evaluation: No evidence of DVT seen on physical exam. Negative Homan's sign.  Discharge Diagnoses: Term Pregnancy-delivered  Discharge Information: Date: 02/20/2014 Activity: pelvic rest Diet: routine Medications: PNV and Ibuprofen Condition: stable Instructions: refer to practice specific booklet Discharge to: home Follow-up Information   [facility] with [facility] In 5 weeks. (post partum appt)    Specialty:  Obstetrics and Gynecology   Contact information:   [address] [place] [address] [phone]      Newborn Data: Live born female  Birth Weight: 6 lb 13.5 oz (3104 g) APGAR: 8, 9  Home with mother.  Shavelle Runkel,Amadi H. 02/20/2014, 10:39 AM

## 2014-02-20 NOTE — Lactation Note (Addendum)
This note was copied from the chart of [name]. Lactation Consultation Note  Patient Name: [name] [id number] Date: 02/20/2014 Reason for consult: Follow-up assessment Per mom nipples are tender , but better than they have been with the use of the nipple shield  @ the consult LC re-sized mom for the her Nipple shield , #20 NS good size for today , and the #24 Nipple shield  Will be a good fit has the areola becomes more compressible. LC reviewed sore nipple and engorgement prevention and tx. Per mom has a DEBP Medela pump at home. And LC discussed the importance of post pumping after 4-6 feedings for 10 -15 mins  To establish and protect milk supply. Mom has been using the comfort gels and LC instructed on the use of  shells.  Mother informed of post-discharge support and given phone number to the lactation department, including services for phone call  assistance; out-patient appointments; and breastfeeding support group. List of other breastfeeding resources in the community given  in the handout. Encouraged mother to call for problems or concerns related to breastfeeding. Mom agreeable to apt  [facility] next Wednesday Sept 30 th.    Maternal Data Formula Feeding for Exclusion: No  Feeding Feeding Type: Breast Fed Length of feed: 40 min  LATCH Score/Interventions thei latch score was prior to this consultants visit  Latch: Grasps breast easily, tongue down, lips flanged, rhythmical sucking. Intervention(s): Skin to skin Intervention(s): Adjust position;Assist with latch;Breast compression  Audible Swallowing: Spontaneous and intermittent Intervention(s): Skin to skin;Hand expression Intervention(s): Alternate breast massage  Type of Nipple: Everted at rest and after stimulation Intervention(s): Hand pump  Comfort (Breast/Nipple): Filling, red/small blisters or bruises, mild/mod discomfort  Problem noted: Mild/Moderate discomfort Interventions   (Cracked/bleeding/bruising/blister): Hand pump;Expressed breast milk to nipple Interventions (Mild/moderate discomfort): Comfort gels;Breast shields  Hold (Positioning): Assistance needed to correctly position infant at breast and maintain latch. Intervention(s): Breastfeeding basics reviewed  LATCH Score: 8  Lactation Tools Discussed/Used Pump Review: Milk Storage Initiated by::  (set up on 11-7 )   Consult Status Consult Status: Follow-up Date: 02/28/14 (230 pm ) Follow-up type: Out-patient    Kathrin Greathouse 02/20/2014, 10:39 AM

## 2014-02-20 NOTE — Lactation Note (Signed)
This note was copied from the chart of [name] [name]. Lactation Consultation Note Has everted short small nipples, red and tender to touch. Baby wasn't able to obtain a deep latch. Fitted #16, then #20 fitted w/more comfort. Has hand pump w/noted colostrum. Heard baby audible swallows. Nipple shield application taught. Patient Name: [name] Dianelys Scinto Today's Date: 02/20/2014     Maternal Data    Feeding Feeding Type: Breast Fed Length of feed: 5 min  LATCH Score/Interventions Latch: Grasps breast easily, tongue down, lips flanged, rhythmical sucking. Intervention(s): Skin to skin Intervention(s): Adjust position;Assist with latch;Breast compression  Audible Swallowing: Spontaneous and intermittent Intervention(s): Skin to skin;Hand expression Intervention(s): Alternate breast massage  Type of Nipple: Everted at rest and after stimulation Intervention(s): Hand pump  Comfort (Breast/Nipple): Filling, red/small blisters or bruises, mild/mod discomfort  Problem noted: Mild/Moderate discomfort Interventions  (Cracked/bleeding/bruising/blister): Hand pump;Expressed breast milk to nipple Interventions (Mild/moderate discomfort): Comfort gels;Breast shields  Hold (Positioning): Assistance needed to correctly position infant at breast and maintain latch. Intervention(s): Breastfeeding basics reviewed;Support Pillows;Position options;Skin to skin  LATCH Score: 8  Lactation Tools Discussed/Used     Consult Status      Garan Frappier, Diamond Nickel 02/20/2014, 7:43 AM

## 2014-02-22 LAB — TYPE AND SCREEN
ABO/RH(D): A POS
Antibody Screen: NEGATIVE
Unit division: 0
Unit division: 0

## 2014-02-24 ENCOUNTER — Encounter (HOSPITAL_COMMUNITY): Payer: Self-pay

## 2014-02-24 ENCOUNTER — Inpatient Hospital Stay (HOSPITAL_COMMUNITY)
Admission: AD | Admit: 2014-02-24 | Discharge: 2014-02-24 | Disposition: A | Discharge status: Home / Self Care | Payer: BC Managed Care – PPO | Source: Ambulatory Visit | Attending: Obstetrics & Gynecology | Admitting: Obstetrics & Gynecology

## 2014-02-24 DIAGNOSIS — N390 Urinary tract infection, site not specified: Secondary | ICD-10-CM | POA: Insufficient documentation

## 2014-02-24 DIAGNOSIS — O2390 Unspecified genitourinary tract infection in pregnancy, unspecified trimester: Secondary | ICD-10-CM | POA: Diagnosis not present

## 2014-02-24 DIAGNOSIS — N61 Mastitis without abscess: Secondary | ICD-10-CM

## 2014-02-24 DIAGNOSIS — O9122 Nonpurulent mastitis associated with the puerperium: Secondary | ICD-10-CM | POA: Diagnosis not present

## 2014-02-24 DIAGNOSIS — O864 Pyrexia of unknown origin following delivery: Secondary | ICD-10-CM | POA: Diagnosis present

## 2014-02-24 LAB — URINE MICROSCOPIC-ADD ON

## 2014-02-24 LAB — URINALYSIS, ROUTINE W REFLEX MICROSCOPIC
Bilirubin Urine: NEGATIVE
Glucose, UA: NEGATIVE mg/dL
KETONES UR: NEGATIVE mg/dL
Nitrite: POSITIVE — AB
PROTEIN: 100 mg/dL — AB
Specific Gravity, Urine: <1.005 — ABNORMAL LOW (ref 1.005–1.030)
UROBILINOGEN UA: 0.2 mg/dL (ref 0.0–1.0)
pH: 6.5 (ref 5.0–8.0)

## 2014-02-24 LAB — CBC
HCT: 29.6 % — ABNORMAL LOW (ref 36.0–46.0)
Hemoglobin: 10 g/dL — ABNORMAL LOW (ref 12.0–15.0)
MCH: 28.7 pg (ref 26.0–34.0)
MCHC: 33.8 g/dL (ref 30.0–36.0)
MCV: 85.1 fL (ref 78.0–100.0)
Platelets: 252 10*3/uL (ref 150–400)
RBC: 3.48 MIL/uL — ABNORMAL LOW (ref 3.87–5.11)
RDW: 13.1 % (ref 11.5–15.5)
WBC: 11.3 10*3/uL — ABNORMAL HIGH (ref 4.0–10.5)

## 2014-02-24 MED ORDER — DEXTROSE 5 % IV SOLN
1.0000 g | Freq: Once | INTRAVENOUS | Status: AC
  Administered 2014-02-24: 1 g via INTRAVENOUS
  Filled 2014-02-24: qty 10

## 2014-02-24 MED ORDER — CEPHALEXIN 500 MG PO CAPS
500.0000 mg | ORAL_CAPSULE | Freq: Three times a day (TID) | ORAL | Status: DC
Start: 2014-02-24 — End: 2014-04-24

## 2014-02-24 MED ORDER — SODIUM CHLORIDE 0.9 % IV SOLN
Freq: Once | INTRAVENOUS | Status: AC
  Administered 2014-02-24: 22:00:00 via INTRAVENOUS

## 2014-02-24 MED ORDER — IRON-VIT C-VIT B12-FOLIC ACID 100-250-0.025-1 MG PO TABS: 1.0000 | ORAL_TABLET | Freq: Every day | ORAL | Status: DC

## 2014-02-24 NOTE — MAU Provider Note (Signed)
Attestation of Attending Supervision of Advanced Practitioner (CNM/NP): Evaluation and management procedures were performed by the Advanced Practitioner under my supervision and collaboration.  I have reviewed the Advanced Practitioner's note and chart, and I agree with the management and plan.  HARRAWAY-Boldman, Angella Montas 11:33 PM     

## 2014-02-24 NOTE — MAU Note (Signed)
Vag delivery 02/18/14 with PPH. No transfusion. States lost about 700cc blood. Fever this afternoon of 100.4. Took Ibuprofen  at 1700. Breastfeeding.
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
## 2014-02-27 ENCOUNTER — Ambulatory Visit (INDEPENDENT_AMBULATORY_CARE_PROVIDER_SITE_OTHER): Payer: BC Managed Care – PPO | Admitting: Family Medicine

## 2014-02-27 ENCOUNTER — Encounter: Payer: Self-pay | Admitting: Family Medicine

## 2014-02-27 VITALS — BP 116/89 | HR 101 | Ht 62.0 in | Wt 195.4 lb

## 2014-02-27 DIAGNOSIS — N390 Urinary tract infection, site not specified: Secondary | ICD-10-CM

## 2014-02-27 DIAGNOSIS — N61 Mastitis without abscess: Secondary | ICD-10-CM

## 2014-02-27 LAB — URINE CULTURE: Colony Count: >=100000

## 2014-02-27 NOTE — Progress Notes (Signed)
    Subjective:    Patient ID: [name] [name] is a 28 y.o. female presenting with Follow-up  on 02/27/2014  HPI: Seen in MAU for mastitis and UTI--urine culture is growing E.coli--sensitive to Keflex. Feeling better.   Erythema of breasts are better.  She feels better.  Review of Systems  Constitutional: Negative for fever and chills.  Respiratory: Negative for shortness of breath.   Cardiovascular: Negative for chest pain.  Gastrointestinal: Negative for nausea, vomiting and abdominal pain.  Genitourinary: Negative for dysuria.  Skin: Negative for rash.      Objective:    BP 116/89  Pulse 101  Ht 5\' 2"  (1.575 m)  Wt 195 lb 6.4 oz (88.633 kg)  BMI 35.73 kg/m2  Breastfeeding? Yes Physical Exam  Constitutional: She is oriented to person, place, and time. She appears well-developed and well-nourished. No distress.  HENT:  Head: Normocephalic and atraumatic.  Eyes: No scleral icterus.  Neck: Neck supple.  Cardiovascular: Normal rate.   Pulmonary/Chest: Effort normal. She exhibits no edema and no swelling. Right breast exhibits no skin change. Left breast exhibits no skin change.  Abdominal: Soft.  Musculoskeletal: She exhibits no tenderness (CVA).  Neurological: She is alert and oriented to person, place, and time.  Skin: Skin is warm and dry.  Psychiatric: She has a normal mood and affect.        Assessment & Plan:   UTI (lower urinary tract infection)  Mastitis Continue full course of Keflex Return in about 4 weeks (around 03/27/2014) for pp check.

## 2014-02-27 NOTE — Progress Notes (Signed)
Follow up from ER  Mastitis and UTI.

## 2014-02-28 ENCOUNTER — Ambulatory Visit (HOSPITAL_COMMUNITY): Payer: BC Managed Care – PPO

## 2014-03-30 ENCOUNTER — Encounter: Payer: Self-pay | Admitting: Obstetrics & Gynecology

## 2014-03-30 ENCOUNTER — Ambulatory Visit (INDEPENDENT_AMBULATORY_CARE_PROVIDER_SITE_OTHER): Payer: BC Managed Care – PPO | Admitting: Obstetrics & Gynecology

## 2014-03-30 DIAGNOSIS — Z30430 Encounter for insertion of intrauterine contraceptive device: Secondary | ICD-10-CM | POA: Diagnosis not present

## 2014-03-30 DIAGNOSIS — Z01812 Encounter for preprocedural laboratory examination: Secondary | ICD-10-CM

## 2014-03-30 LAB — POCT URINE PREGNANCY: Preg Test, Ur: NEGATIVE

## 2014-03-30 MED ORDER — LEVONORGESTREL 20 MCG/24HR IU IUD: 1.0000 | INTRAUTERINE_SYSTEM | Freq: Once | INTRAUTERINE | Status: DC

## 2014-03-30 NOTE — Patient Instructions (Signed)

## 2014-03-30 NOTE — Progress Notes (Signed)
Patient ID: [name] OdorKelly C Rothbauer, female   DOB: 1985/06/20, 28 y.o.   MRN: [id number] Here today for postpartum visit.  [facility].

## 2014-03-30 NOTE — Progress Notes (Signed)
    Subjective:     [name] [name] is a 28 y.o. 821P1001 female who presents for a postpartum visit. She is 5 weeks postpartum following a vacuum-assisted vaginal delivery. I have fully reviewed the prenatal and intrapartum course. The delivery was at 41 gestational weeks.  Anesthesia: epidural. Postpartum course has been uncomplicated. Baby's course has been uncomplicated. Baby is feeding by bottle. Bleeding: no bleeding. Bowel function is normal. Bladder function is normal. Patient is sexually active. Contraception method is condoms and desires Mirena IUD today. Postpartum depression screening: negative.  The following portions of the patient's history were reviewed and updated as appropriate: allergies, current medications, past family history, past medical history, past social history, past surgical history and problem list..  Normal pap and negative HRHPV on 11/23/12.  Review of Systems Pertinent items are noted in HPI.   Objective:    BP 115/81  Pulse 88  Ht 5\' 2"  (1.575 m)  Wt 197 lb (89.359 kg)  BMI 36.02 kg/m2  Breastfeeding? No  General:  alert and no distress   Breasts:  inspection negative, no nipple discharge or bleeding, no masses or nodularity palpable  Lungs: clear to auscultation bilaterally  Heart:  regular rate and rhythm  Abdomen: soft, non-tender; bowel sounds normal; no masses,  no organomegaly   Vulva:  normal  Vagina: normal vagina, no discharge, exudate, lesion, or erythema  Cervix:  multiparous appearance and no lesions  Corpus: normal size, contour, position, consistency, mobility, non-tender  Adnexa:  normal adnexa and no mass, fullness, tenderness  Rectal Exam: Not performed.     IUD Insertion Procedure Note Patient identified, informed consent performed.  Discussed risks of irregular bleeding, cramping, infection, malpositioning or misplacement of the IUD outside the uterus which may require further procedure such as laparoscopy. Time out was performed.   Urine pregnancy test negative.  Speculum placed in the vagina.  Cervix visualized.  Cleaned with Betadine x 2.  Grasped anteriorly with a single tooth tenaculum.  Uterus sounded to 9 cm.  Mirena IUD placed per manufacturer's recommendations.  Strings trimmed to 3 cm. Tenaculum was removed, good hemostasis noted.  Patient tolerated procedure well.   Patient was given post-procedure instructions.  She was advised to have backup contraception for one week.    Assessment:   Normal postpartum exam. Pap smear not done at today's visit. Mirena IUD placement  Plan:   1. Contraception: Mirena IUD. Patient was also asked to check IUD strings periodically and follow up in 4 weeks for IUD check. 2.  Routine preventative health maintenance measures emphasized.   Jaynie CollinsUGONNA  Juvenal Umar, MD, FACOG Attending Obstetrician & Gynecologist Center for [facility], [facility]

## 2014-03-30 NOTE — Addendum Note (Signed)
Addended by: Pennie BanterSMITH, MARNI W on: 03/30/2014 10:39 AM   Modules accepted: Orders

## 2014-04-02 ENCOUNTER — Encounter: Payer: Self-pay | Admitting: Obstetrics & Gynecology

## 2014-04-18 ENCOUNTER — Telehealth: Payer: Self-pay

## 2014-04-18 NOTE — Telephone Encounter (Signed)
Patient had a UTI [facility] Patient will return to clinic if symptoms continue for follow up.

## 2014-04-24 ENCOUNTER — Encounter: Payer: Self-pay | Admitting: Family Medicine

## 2014-04-24 ENCOUNTER — Ambulatory Visit (INDEPENDENT_AMBULATORY_CARE_PROVIDER_SITE_OTHER): Payer: BC Managed Care – PPO | Admitting: Family Medicine

## 2014-04-24 VITALS — BP 115/87 | HR 78 | Wt 197.0 lb

## 2014-04-24 DIAGNOSIS — Z30431 Encounter for routine checking of intrauterine contraceptive device: Secondary | ICD-10-CM

## 2014-04-24 NOTE — Patient Instructions (Signed)
Levonorgestrel intrauterine device (IUD) What is this medicine? LEVONORGESTREL IUD (LEE voe nor jes trel) is a contraceptive (birth control) device. The device is placed inside the uterus by a healthcare professional. It is used to prevent pregnancy and can also be used to treat heavy bleeding that occurs during your period. Depending on the device, it can be used for 3 to 5 years. This medicine may be used for other purposes; ask your health care provider or pharmacist if you have questions. COMMON BRAND NAME(S): LILETTA, Mirena, Skyla What should I tell my health care provider before I take this medicine? They need to know if you have any of these conditions: -abnormal Pap smear -cancer of the breast, uterus, or cervix -diabetes -endometritis -genital or pelvic infection now or in the past -have more than one sexual partner or your partner has more than one partner -heart disease -history of an ectopic or tubal pregnancy -immune system problems -IUD in place -liver disease or tumor -problems with blood clots or take blood-thinners -use intravenous drugs -uterus of unusual shape -vaginal bleeding that has not been explained -an unusual or allergic reaction to levonorgestrel, other hormones, silicone, or polyethylene, medicines, foods, dyes, or preservatives -pregnant or trying to get pregnant -breast-feeding How should I use this medicine? This device is placed inside the uterus by a health care professional. Talk to your pediatrician regarding the use of this medicine in children. Special care may be needed. Overdosage: If you think you have taken too much of this medicine contact a poison control center or emergency room at once. NOTE: This medicine is only for you. Do not share this medicine with others. What if I miss a dose? This does not apply. What may interact with this medicine? Do not take this medicine with any of the following  medications: -amprenavir -bosentan -fosamprenavir This medicine may also interact with the following medications: -aprepitant -barbiturate medicines for inducing sleep or treating seizures -bexarotene -griseofulvin -medicines to treat seizures like carbamazepine, ethotoin, felbamate, oxcarbazepine, phenytoin, topiramate -modafinil -pioglitazone -rifabutin -rifampin -rifapentine -some medicines to treat HIV infection like atazanavir, indinavir, lopinavir, nelfinavir, tipranavir, ritonavir -St. John's wort -warfarin This list may not describe all possible interactions. Give your health care provider a list of all the medicines, herbs, non-prescription drugs, or dietary supplements you use. Also tell them if you smoke, drink alcohol, or use illegal drugs. Some items may interact with your medicine. What should I watch for while using this medicine? Visit your doctor or health care professional for regular check ups. See your doctor if you or your partner has sexual contact with others, becomes HIV positive, or gets a sexual transmitted disease. This product does not protect you against HIV infection (AIDS) or other sexually transmitted diseases. You can check the placement of the IUD yourself by reaching up to the top of your vagina with clean fingers to feel the threads. Do not pull on the threads. It is a good habit to check placement after each menstrual period. Call your doctor right away if you feel more of the IUD than just the threads or if you cannot feel the threads at all. The IUD may come out by itself. You may become pregnant if the device comes out. If you notice that the IUD has come out use a backup birth control method like condoms and call your health care provider. Using tampons will not change the position of the IUD and are okay to use during your period. What side effects may   I notice from receiving this medicine? Side effects that you should report to your doctor or  health care professional as soon as possible: -allergic reactions like skin rash, itching or hives, swelling of the face, lips, or tongue -fever, flu-like symptoms -genital sores -high blood pressure -no menstrual period for 6 weeks during use -pain, swelling, warmth in the leg -pelvic pain or tenderness -severe or sudden headache -signs of pregnancy -stomach cramping -sudden shortness of breath -trouble with balance, talking, or walking -unusual vaginal bleeding, discharge -yellowing of the eyes or skin Side effects that usually do not require medical attention (report to your doctor or health care professional if they continue or are bothersome): -acne -breast pain -change in sex drive or performance -changes in weight -cramping, dizziness, or faintness while the device is being inserted -headache -irregular menstrual bleeding within first 3 to 6 months of use -nausea This list may not describe all possible side effects. Call your doctor for medical advice about side effects. You may report side effects to FDA at 1-800-FDA-1088. Where should I keep my medicine? This does not apply. NOTE: This sheet is a summary. It may not cover all possible information. If you have questions about this medicine, talk to your doctor, pharmacist, or health care provider.  2015, Elsevier/Gold Standard. (2011-06-18 13:54:04)  

## 2014-04-24 NOTE — Progress Notes (Signed)
    Subjective:    Patient ID: [name] [name] is a 28 y.o. female presenting with Contraception  on 04/24/2014  HPI: Has IUID.  Had some bleeding, but now is only spotting.  Review of Systems  Constitutional: Negative for fever and chills.  Respiratory: Negative for shortness of breath.   Cardiovascular: Negative for chest pain.  Gastrointestinal: Negative for nausea, vomiting and abdominal pain.  Genitourinary: Negative for dysuria.  Skin: Negative for rash.      Objective:    BP 115/87 mmHg  Pulse 78  Wt 197 lb (89.359 kg)  LMP 04/07/2014  Breastfeeding? No Physical Exam  Constitutional: She is oriented to person, place, and time. She appears well-developed and well-nourished. No distress.  HENT:  Head: Normocephalic and atraumatic.  Eyes: No scleral icterus.  Neck: Neck supple.  Cardiovascular: Normal rate.   Pulmonary/Chest: Effort normal.  Abdominal: Soft.  Genitourinary:  IUD strings noted  Neurological: She is alert and oriented to person, place, and time.  Skin: Skin is warm and dry.  Psychiatric: She has a normal mood and affect.        Assessment & Plan:    Return in about 1 year (around 04/25/2015).

## 2015-02-26 ENCOUNTER — Encounter: Payer: Self-pay | Admitting: Obstetrics & Gynecology

## 2015-02-26 ENCOUNTER — Ambulatory Visit (INDEPENDENT_AMBULATORY_CARE_PROVIDER_SITE_OTHER): Payer: BLUE CROSS/BLUE SHIELD | Admitting: Obstetrics & Gynecology

## 2015-02-26 VITALS — BP 125/84 | HR 81 | Ht 62.0 in | Wt 195.0 lb

## 2015-02-26 DIAGNOSIS — Z124 Encounter for screening for malignant neoplasm of cervix: Secondary | ICD-10-CM | POA: Diagnosis not present

## 2015-02-26 DIAGNOSIS — Z01419 Encounter for gynecological examination (general) (routine) without abnormal findings: Secondary | ICD-10-CM

## 2015-02-26 DIAGNOSIS — Z23 Encounter for immunization: Secondary | ICD-10-CM | POA: Diagnosis not present

## 2015-02-26 DIAGNOSIS — Z30431 Encounter for routine checking of intrauterine contraceptive device: Secondary | ICD-10-CM | POA: Diagnosis not present

## 2015-02-26 DIAGNOSIS — Z0000 Encounter for general adult medical examination without abnormal findings: Secondary | ICD-10-CM

## 2015-02-26 LAB — LIPID PANEL
CHOL/HDL RATIO: 4.3 ratio (ref <=5.0)
Cholesterol: 180 mg/dL (ref 125–200)
HDL: 42 mg/dL — ABNORMAL LOW (ref >=46)
LDL Cholesterol: 115 mg/dL (ref <130)
TRIGLYCERIDES: 116 mg/dL (ref <150)
VLDL: 23 mg/dL (ref <30)

## 2015-02-26 LAB — CBC
HCT: 39.2 % (ref 36.0–46.0)
HEMOGLOBIN: 13.4 g/dL (ref 12.0–15.0)
MCH: 28.6 pg (ref 26.0–34.0)
MCHC: 34.2 g/dL (ref 30.0–36.0)
MCV: 83.6 fL (ref 78.0–100.0)
MPV: 9.6 fL (ref 8.6–12.4)
PLATELETS: 261 10*3/uL (ref 150–400)
RBC: 4.69 MIL/uL (ref 3.87–5.11)
RDW: 13.4 % (ref 11.5–15.5)
WBC: 7.3 10*3/uL (ref 4.0–10.5)

## 2015-02-26 LAB — COMPREHENSIVE METABOLIC PANEL
ALBUMIN: 4.6 g/dL (ref 3.6–5.1)
ALK PHOS: 54 U/L (ref 33–115)
ALT: 12 U/L (ref 6–29)
AST: 10 U/L (ref 10–30)
BUN: 12 mg/dL (ref 7–25)
CALCIUM: 9.3 mg/dL (ref 8.6–10.2)
CHLORIDE: 104 mmol/L (ref 98–110)
CO2: 23 mmol/L (ref 20–31)
Creat: 0.79 mg/dL (ref 0.50–1.10)
Glucose, Bld: 83 mg/dL (ref 65–99)
POTASSIUM: 4 mmol/L (ref 3.5–5.3)
Sodium: 136 mmol/L (ref 135–146)
TOTAL PROTEIN: 7.1 g/dL (ref 6.1–8.1)
Total Bilirubin: 0.7 mg/dL (ref 0.2–1.2)

## 2015-02-26 NOTE — Progress Notes (Signed)
Subjective:    [name] [name] is a 29 y.o. MW P60 (1 yo daughter)  female who presents for an annual exam. The patient has no complaints today. She has only sporadic periods with her Mirena. Happy with Mirena. The patient is sexually active. GYN screening history: last pap: was normal. The patient wears seatbelts: yes. The patient participates in regular exercise: yes. Has the patient ever been transfused or tattooed?: no. The patient reports that there is not domestic violence in her life.   Menstrual History: OB History    Gravida Para Term Preterm AB TAB SAB Ectopic Multiple Living   0 0 0 0 0 0 1      Menarche age: 36  Patient's last menstrual period was 02/03/2015.    The following portions of the patient's history were reviewed and updated as appropriate: allergies, current medications, past family history, past medical history, past social history, past surgical history and problem list.  Review of Systems A comprehensive review of systems was negative.  Works at [employer]. Flu vaccine today.    Objective:    BP 125/84 mmHg  Pulse 81  Ht  (1.575 m)  Wt 195 lb (88.451 kg)  BMI 35.66 kg/m2  LMP 02/03/2015  Breastfeeding? No  General Appearance:    Alert, cooperative, no distress, appears stated age  Head:    Normocephalic, without obvious abnormality, atraumatic  Eyes:    PERRL, conjunctiva/corneas clear, EOM's intact, fundi    benign, both eyes  Ears:    Normal TM's and external ear canals, both ears  Nose:   Nares normal, septum midline, mucosa normal, no drainage    or sinus tenderness  Throat:   Lips, mucosa, and tongue normal; teeth and gums normal  Neck:   Supple, symmetrical, trachea midline, no adenopathy;    thyroid:  no enlargement/tenderness/nodules; no carotid   bruit or JVD  Back:     Symmetric, no curvature, ROM normal, no CVA tenderness  Lungs:     Clear to auscultation bilaterally, respirations unlabored  Chest Wall:    No tenderness  or deformity   Heart:    Regular rate and rhythm, S1 and S2 normal, no murmur, rub   or gallop  Breast Exam:    No tenderness, masses, or nipple abnormality  Abdomen:     Soft, non-tender, bowel sounds active all four quadrants,    no masses, no organomegaly  Genitalia:    Normal female without lesion, discharge or tenderness, NSSA, NT, mobile, normal adnexal exam     Extremities:   Extremities normal, atraumatic, no cyanosis or edema  Pulses:   2+ and symmetric all extremities  Skin:   Skin color, texture, turgor normal, no rashes or lesions  Lymph nodes:   Cervical, supraclavicular, and axillary nodes normal  Neurologic:   CNII-XII intact, normal strength, sensation and reflexes    throughout  .    Assessment:    Healthy female exam.    Plan:     Breast self exam technique reviewed and patient encouraged to perform self-exam monthly. Thin prep Pap smear.   Flu vaccine Fasting labs today

## 2015-02-27 LAB — TSH: TSH: 4.602 u[IU]/mL — AB (ref 0.350–4.500)

## 2015-02-28 LAB — CYTOLOGY - PAP

## 2015-03-06 ENCOUNTER — Telehealth: Payer: Self-pay | Admitting: *Deleted

## 2015-03-06 NOTE — Telephone Encounter (Signed)
-----   Message from Allie Bossier, MD sent at 03/06/2015  5:07 PM EDT ----- She will need free T3 and free T4. Thanks

## 2015-03-06 NOTE — Telephone Encounter (Signed)
Informed pt of result, scheduled lab draw for Monday 03-11-15 @ 9:30

## 2015-03-11 ENCOUNTER — Other Ambulatory Visit (INDEPENDENT_AMBULATORY_CARE_PROVIDER_SITE_OTHER): Payer: BLUE CROSS/BLUE SHIELD | Admitting: *Deleted

## 2015-03-11 DIAGNOSIS — Z1329 Encounter for screening for other suspected endocrine disorder: Secondary | ICD-10-CM

## 2015-03-11 DIAGNOSIS — R7989 Other specified abnormal findings of blood chemistry: Secondary | ICD-10-CM

## 2015-03-11 NOTE — Progress Notes (Signed)
Had TSH drawn, results slightly abnormal. Dr Marice Potter ordered a free T3 and T4 for clarification. Blood drawn and sent to lab.

## 2015-03-12 LAB — T3, FREE: T3, Free: 3.3 pg/mL (ref 2.3–4.2)

## 2015-03-12 LAB — T4, FREE: Free T4: 1.07 ng/dL (ref 0.80–1.80)

## 2015-11-26 ENCOUNTER — Encounter: Payer: Self-pay | Admitting: Obstetrics & Gynecology

## 2015-11-26 ENCOUNTER — Ambulatory Visit (INDEPENDENT_AMBULATORY_CARE_PROVIDER_SITE_OTHER): Payer: BLUE CROSS/BLUE SHIELD | Admitting: Obstetrics & Gynecology

## 2015-11-26 VITALS — BP 125/92 | HR 108 | Resp 16 | Ht 62.0 in | Wt 193.0 lb

## 2015-11-26 DIAGNOSIS — Z30432 Encounter for removal of intrauterine contraceptive device: Secondary | ICD-10-CM | POA: Diagnosis not present

## 2015-11-26 NOTE — Patient Instructions (Signed)
Return to clinic for any scheduled appointments or for any gynecologic concerns as needed.   

## 2015-11-26 NOTE — Progress Notes (Signed)
    GYNECOLOGY CLINIC PROCEDURE NOTE  [name] [name] is a 30 y.o. G1P1001 here for Mirena IUD removal. No GYN concerns.  Last pap smear was on 01/2015 and was normal.  IUD Removal  Patient identified, informed consent performed, consent signed.  Patient was in the dorsal lithotomy position, normal external genitalia was noted.  A speculum was placed in the patient's vagina, normal discharge was noted, no lesions. The cervix was visualized, no lesions, no abnormal discharge.  The strings of the IUD were grasped and pulled using ring forceps. The IUD was removed in its entirety. Patient tolerated the procedure well.    Patient plans for pregnancy soon and she was told to avoid teratogens, take PNV and folic acid.  Routine preventative health maintenance measures emphasized.   Jaynie CollinsUGONNA  ANYANWU, MD, FACOG Attending Obstetrician & Gynecologist, [facility]

## 2016-01-28 ENCOUNTER — Ambulatory Visit (INDEPENDENT_AMBULATORY_CARE_PROVIDER_SITE_OTHER): Payer: BLUE CROSS/BLUE SHIELD | Admitting: Obstetrics & Gynecology

## 2016-01-28 ENCOUNTER — Ambulatory Visit
Admission: RE | Admit: 2016-01-28 | Discharge: 2016-01-28 | Disposition: A | Discharge status: Home / Self Care | Payer: BLUE CROSS/BLUE SHIELD | Source: Ambulatory Visit | Attending: Obstetrics & Gynecology | Admitting: Obstetrics & Gynecology

## 2016-01-28 ENCOUNTER — Telehealth: Payer: Self-pay | Admitting: *Deleted

## 2016-01-28 ENCOUNTER — Encounter: Payer: Self-pay | Admitting: Obstetrics & Gynecology

## 2016-01-28 VITALS — BP 115/73 | HR 90 | Wt 209.0 lb

## 2016-01-28 DIAGNOSIS — Z36 Encounter for antenatal screening of mother: Secondary | ICD-10-CM | POA: Diagnosis not present

## 2016-01-28 DIAGNOSIS — O3680X0 Pregnancy with inconclusive fetal viability, not applicable or unspecified: Secondary | ICD-10-CM

## 2016-01-28 NOTE — Progress Notes (Signed)
  Phillips OdorKelly C Winstanley is a 30 y.o. G2P1001 at 6334w0d by LMP being seen today for initial OB visit.  On initial clinic ultrasound, fetal pole was recognized without cardiac activity and measured 7 - [redacted] weeks GA, not consistent with LMP.  This is concerning for possible earlier than thought IUP vs missed abortion; [facility].  Patient agreed with plan.  Jaynie CollinsUGONNA  ANYANWU, MD, FACOG Attending Obstetrician & Gynecologist, [facility] Center for [facility], [facility]

## 2016-01-28 NOTE — Telephone Encounter (Signed)
Sent STAT US report to Dr Macon LargeAnyanwu

## 2016-01-28 NOTE — Progress Notes (Signed)
Phone Call Documentation/Results Note  01/28/2016 OBSTETRIC <14 WK US AND TRANSVAGINAL OB US  CLINICAL DATA:  Pregnancy with inconclusive fetal viability, not applicable or unspecified fetus O36.80X0 (ICD-10-CM)  COMPARISON:  None. FINDINGS: Intrauterine gestational sac: Yes Yolk sac:  Yes Embryo:  Yes Cardiac Activity: No convincing cardiac activity. Heart Rate: No fetal heart tones detected CRL: 15.4  mm   7 w  6 d    US EDC: 09/09/2016 Subchorionic hemorrhage:  None visualized. Maternal uterus/adnexae: Right ovary not visualized. Normal left ovary. No adnexal masses. No pelvic free fluid. IMPRESSION: 1. Nonviable intrauterine pregnancy. Embryo a has a crown-rump length of 15.4 mm, but no detectable fetal heart tones or cardiac activity. Findings meet definitive criteria for failed pregnancy. This follows SRU consensus guidelines: Diagnostic Criteria for Nonviable Pregnancy Early in the First Trimester. Macy Mis Engl J Med [phone]. 2. No other abnormality. Electronically Signed   By: Amie Portlandavid  Ormond M.D.   On: 01/28/2016 11:06   Patient was told to come in in to discuss results; she wanted to hear results over the phone. Ultrasound results were reviewed with patient; appropriate support given. Discussed management options of expectant management vs misoprostol vs D&E; she opted for expectant management for now.  Follow up appointment made for 02/11/16 at 1030; this was communicated to the patient.    Jaynie CollinsUGONNA  ANYANWU, MD, FACOG Attending Obstetrician & Gynecologist, [facility], [facility]

## 2016-01-28 NOTE — Progress Notes (Signed)
Bedside US Shows single IUP measuring 5231w1d by CRL. No cardiac activity seen.

## 2016-01-28 NOTE — Patient Instructions (Signed)
Heartstrings  Support group for patients dealing with losses in pregnancy Http://www.heartstringssupport.org   Incomplete Miscarriage A miscarriage is the sudden loss of an unborn baby (fetus) before the 20th week of pregnancy. In an incomplete miscarriage, parts of the fetus or placenta (afterbirth) remain in the body.  Having a miscarriage can be an emotional experience. Talk with your health care provider about any questions you may have about miscarrying, the grieving process, and your future pregnancy plans. CAUSES   Problems with the fetal chromosomes that make it impossible for the baby to develop normally. Problems with the baby's genes or chromosomes are most often the result of errors that occur by chance as the embryo divides and grows. The problems are not inherited from the parents.  Infection of the cervix or uterus.  Hormone problems.  Problems with the cervix, such as having an incompetent cervix. This is when the tissue in the cervix is not strong enough to hold the pregnancy.  Problems with the uterus, such as an abnormally shaped uterus, uterine fibroids, or congenital abnormalities.  Certain medical conditions.  Smoking, drinking alcohol, or taking illegal drugs.  Trauma. SYMPTOMS   Vaginal bleeding or spotting, with or without cramps or pain.  Pain or cramping in the abdomen or lower back.  Passing fluid, tissue, or blood clots from the vagina. DIAGNOSIS  Your health care provider will perform a physical exam. You may also have an ultrasound to confirm the miscarriage. Blood or urine tests may also be ordered. TREATMENT   Expectant management can be done if this happens early in pregnancy.  Medication can also be given to help with completing the miscarriage.  A dilation and curettage (D&C) procedure can be performed. During a D&C procedure, the cervix is widened (dilated) and any remaining fetal or placental tissue is gently removed from the  uterus.  Antibiotic medicines are prescribed if there is an infection. Other medicines may be given to reduce the size of the uterus (contract) if there is a lot of bleeding.  If you have Rh negative blood and your baby was Rh positive, you will need a Rho (D) immune globulin shot. This shot will protect any future baby from having Rh blood problems in future pregnancies.  You may be confined to bed rest. This means you should stay in bed and only get up to use the bathroom. HOME CARE INSTRUCTIONS   Rest as directed by your health care provider.  Restrict activity as directed by your health care provider. You may be allowed to continue light activity if curettage was not done but you require further treatment.  Keep track of the number of pads you use each day. Keep track of how soaked (saturated) they are. Record this information.  Do not  use tampons.  Do not douche or have sexual intercourse until approved by your health care provider.  Keep all follow-up appointments for reevaluation and continuing management.  Only take over-the-counter or prescription medicines for pain, fever, or discomfort as directed by your health care provider.  Take antibiotic medicine as directed by your health care provider. Make sure you finish it even if you start to feel better. SEEK IMMEDIATE MEDICAL CARE IF:   You experience severe cramps in your stomach, back, or abdomen.  You have an unexplained temperature (make sure to record these temperatures).  You pass large clots or tissue (save these for your health care provider to inspect).  Your bleeding increases.  You become light-headed, weak, or have  fainting episodes. MAKE SURE YOU:   Understand these instructions.  Will watch your condition.  Will get help right away if you are not doing well or get worse.   This information is not intended to replace advice given to you by your health care provider. Make sure you discuss any questions  you have with your health care provider.   Document Released: 05/18/2005 Document Revised: 06/08/2014 Document Reviewed: 12/15/2012 Elsevier Interactive Patient Education [employer]2016 Elsevier Inc.

## 2016-02-11 ENCOUNTER — Ambulatory Visit (INDEPENDENT_AMBULATORY_CARE_PROVIDER_SITE_OTHER): Payer: BLUE CROSS/BLUE SHIELD | Admitting: Obstetrics & Gynecology

## 2016-02-11 ENCOUNTER — Encounter: Payer: Self-pay | Admitting: Obstetrics & Gynecology

## 2016-02-11 VITALS — BP 137/85 | HR 120 | Resp 20 | Ht 62.0 in | Wt 199.0 lb

## 2016-02-11 DIAGNOSIS — O021 Missed abortion: Secondary | ICD-10-CM

## 2016-02-11 DIAGNOSIS — IMO0002 Reserved for concepts with insufficient information to code with codable children: Secondary | ICD-10-CM

## 2016-02-11 DIAGNOSIS — O039 Complete or unspecified spontaneous abortion without complication: Secondary | ICD-10-CM
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
## 2016-05-29 DIAGNOSIS — J069 Acute upper respiratory infection, unspecified: Secondary | ICD-10-CM | POA: Diagnosis not present

## 2016-06-01 NOTE — L&D Delivery Note (Signed)
  Patient is 31 y.o. [id number] 4338w5d admitted in active labor, hx of GBS + status   Delivery Note At 9:32 PM a viable female was delivered via Vaginal, Spontaneous Delivery (Presentation: ROA; vertex ).  APGAR: 9, 10; weight  pending.   Placenta status: delivered spontaneously with gentle traction, in tact.  Cord: 3vc  with the following complications: none.  Cord pH: not sent  Anesthesia:  epidural Episiotomy: None Lacerations: None Suture Repair: none Est. Blood Loss (mL): 400  Mom to postpartum.  Baby to Couplet care / Skin to Skin.  [name] [name] 01/27/2017, 10:28 PM      Upon arrival patient was complete and pushing. She pushed with good maternal effort to deliver a healthy baby girl. Baby delivered without difficulty, was noted to have good tone and place on maternal abdomen for oral suctioning, drying and stimulation. Delayed cord clamping performed. Placenta delivered intact with 3V cord. Vaginal canal and perineum was inspected and in tact; hemostatic. Pitocin was started and uterus massaged until bleeding slowed. Counts of sharps, instruments, and lap pads were all correct.   [name] SersAngela C Riccio, DO PGY-2 8/29/201810:28 PM  Patient is a [id number] at 7638w5d who was admitted from the office with SOL, essentially uncomplicated prenatal course.  She progressed with augmentation via Pitocin.  I was gloved and present for delivery in its entirety.  Second stage of labor progressed to delivery after approx 10 mins of pushing.  No decels during second stage noted.  Complications: none  Lacerations: none  EBL: 400cc  [name] [name], CNM 11:18 PM 01/27/2017

## 2016-06-10 ENCOUNTER — Encounter: Payer: Self-pay | Admitting: Family Medicine

## 2016-06-10 ENCOUNTER — Ambulatory Visit (INDEPENDENT_AMBULATORY_CARE_PROVIDER_SITE_OTHER): Payer: BLUE CROSS/BLUE SHIELD | Admitting: Family Medicine

## 2016-06-10 DIAGNOSIS — Z113 Encounter for screening for infections with a predominantly sexual mode of transmission: Secondary | ICD-10-CM

## 2016-06-10 DIAGNOSIS — E669 Obesity, unspecified: Secondary | ICD-10-CM

## 2016-06-10 DIAGNOSIS — Z3490 Encounter for supervision of normal pregnancy, unspecified, unspecified trimester: Secondary | ICD-10-CM | POA: Diagnosis not present

## 2016-06-10 DIAGNOSIS — O99211 Obesity complicating pregnancy, first trimester: Secondary | ICD-10-CM

## 2016-06-10 DIAGNOSIS — Z23 Encounter for immunization: Secondary | ICD-10-CM

## 2016-06-10 DIAGNOSIS — Z3491 Encounter for supervision of normal pregnancy, unspecified, first trimester: Secondary | ICD-10-CM

## 2016-06-10 DIAGNOSIS — O99210 Obesity complicating pregnancy, unspecified trimester: Secondary | ICD-10-CM

## 2016-06-10 MED ORDER — DOXYLAMINE-PYRIDOXINE 10-10 MG PO TBEC: DELAYED_RELEASE_TABLET | ORAL | 6 refills | Status: DC

## 2016-06-10 NOTE — Progress Notes (Signed)
[id number] US shows single IUP measuring 7346w5d and FHR 156

## 2016-06-10 NOTE — Patient Instructions (Signed)
First Trimester of Pregnancy  The first trimester of pregnancy is from week 1 until the end of week 12 (months 1 through 3). A week after a sperm fertilizes an egg, the egg will implant on the wall of the uterus. This embryo will begin to develop into a baby. Genes from you and your partner are forming the baby. The female genes determine whether the baby is a boy or a girl. At 6-8 weeks, the eyes and face are formed, and the heartbeat can be seen on ultrasound. At the end of 12 weeks, all the baby's organs are formed.   Now that you are pregnant, you will want to do everything you can to have a healthy baby. Two of the most important things are to get good prenatal care and to follow your health care provider's instructions. Prenatal care is all the medical care you receive before the baby's birth. This care will help prevent, find, and treat any problems during the pregnancy and childbirth.  BODY CHANGES  Your body goes through many changes during pregnancy. The changes vary from woman to woman.   · You may gain or lose a couple of pounds at first.  · You may feel sick to your stomach (nauseous) and throw up (vomit). If the vomiting is uncontrollable, call your health care provider.  · You may tire easily.  · You may develop headaches that can be relieved by medicines approved by your health care provider.  · You may urinate more often. Painful urination may mean you have a bladder infection.  · You may develop heartburn as a result of your pregnancy.  · You may develop constipation because certain hormones are causing the muscles that push waste through your intestines to slow down.  · You may develop hemorrhoids or swollen, bulging veins (varicose veins).  · Your breasts may begin to grow larger and become tender. Your nipples may stick out more, and the tissue that surrounds them (areola) may become darker.  · Your gums may bleed and may be sensitive to brushing and flossing.   · Dark spots or blotches (chloasma, mask of pregnancy) may develop on your face. This will likely fade after the baby is born.  · Your menstrual periods will stop.  · You may have a loss of appetite.  · You may develop cravings for certain kinds of food.  · You may have changes in your emotions from day to day, such as being excited to be pregnant or being concerned that something may go wrong with the pregnancy and baby.  · You may have more vivid and strange dreams.  · You may have changes in your hair. These can include thickening of your hair, rapid growth, and changes in texture. Some women also have hair loss during or after pregnancy, or hair that feels dry or thin. Your hair will most likely return to normal after your baby is born.  WHAT TO EXPECT AT YOUR PRENATAL VISITS  During a routine prenatal visit:  · You will be weighed to make sure you and the baby are growing normally.  · Your blood pressure will be taken.  · Your abdomen will be measured to track your baby's growth.  · The fetal heartbeat will be listened to starting around week 10 or 12 of your pregnancy.  · Test results from any previous visits will be discussed.  Your health care provider may ask you:  · How you are feeling.  · If you   are feeling the baby move.  · If you have had any abnormal symptoms, such as leaking fluid, bleeding, severe headaches, or abdominal cramping.  · If you are using any tobacco products, including cigarettes, chewing tobacco, and electronic cigarettes.  · If you have any questions.  Other tests that may be performed during your first trimester include:  · Blood tests to find your blood type and to check for the presence of any previous infections. They will also be used to check for low iron levels (anemia) and Rh antibodies. Later in the pregnancy, blood tests for diabetes will be done along with other tests if problems develop.  · Urine tests to check for infections, diabetes, or protein in the urine.   · An ultrasound to confirm the proper growth and development of the baby.  · An amniocentesis to check for possible genetic problems.  · Fetal screens for spina bifida and Down syndrome.  · You may need other tests to make sure you and the baby are doing well.  · HIV (human immunodeficiency virus) testing. Routine prenatal testing includes screening for HIV, unless you choose not to have this test.  HOME CARE INSTRUCTIONS   Medicines  · Follow your health care provider's instructions regarding medicine use. Specific medicines may be either safe or unsafe to take during pregnancy.  · Take your prenatal vitamins as directed.  · If you develop constipation, try taking a stool softener if your health care provider approves.  Diet  · Eat regular, well-balanced meals. Choose a variety of foods, such as meat or vegetable-based protein, fish, milk and low-fat dairy products, vegetables, fruits, and whole grain breads and cereals. Your health care provider will help you determine the amount of weight gain that is right for you.  · Avoid raw meat and uncooked cheese. These carry germs that can cause birth defects in the baby.  · Eating four or five small meals rather than three large meals a day may help relieve nausea and vomiting. If you start to feel nauseous, eating a few soda crackers can be helpful. Drinking liquids between meals instead of during meals also seems to help nausea and vomiting.  · If you develop constipation, eat more high-fiber foods, such as fresh vegetables or fruit and whole grains. Drink enough fluids to keep your urine clear or pale yellow.  Activity and Exercise  · Exercise only as directed by your health care provider. Exercising will help you:    Control your weight.    Stay in shape.    Be prepared for labor and delivery.  · Experiencing pain or cramping in the lower abdomen or low back is a good sign that you should stop exercising. Check with your health care provider  before continuing normal exercises.  · Try to avoid standing for long periods of time. Move your legs often if you must stand in one place for a long time.  · Avoid heavy lifting.  · Wear low-heeled shoes, and practice good posture.  · You may continue to have sex unless your health care provider directs you otherwise.  Relief of Pain or Discomfort  · Wear a good support bra for breast tenderness.    · Take warm sitz baths to soothe any pain or discomfort caused by hemorrhoids. Use hemorrhoid cream if your health care provider approves.    · Rest with your legs elevated if you have leg cramps or low back pain.  · If you develop varicose veins in your   legs, wear support hose. Elevate your feet for 15 minutes, 3-4 times a day. Limit salt in your diet.  Prenatal Care  · Schedule your prenatal visits by the twelfth week of pregnancy. They are usually scheduled monthly at first, then more often in the last 2 months before delivery.  · Write down your questions. Take them to your prenatal visits.  · Keep all your prenatal visits as directed by your health care provider.  Safety  · Wear your seat belt at all times when driving.  · Make a list of emergency phone numbers, including numbers for family, friends, the hospital, and police and fire departments.  General Tips  · Ask your health care provider for a referral to a local prenatal education class. Begin classes no later than at the beginning of month 6 of your pregnancy.  · Ask for help if you have counseling or nutritional needs during pregnancy. Your health care provider can offer advice or refer you to specialists for help with various needs.  · Do not use hot tubs, steam rooms, or saunas.  · Do not douche or use tampons or scented sanitary pads.  · Do not cross your legs for long periods of time.  · Avoid cat litter boxes and soil used by cats. These carry germs that can cause birth defects in the baby and possibly loss of the fetus by miscarriage or stillbirth.   · Avoid all smoking, herbs, alcohol, and medicines not prescribed by your health care provider. Chemicals in these affect the formation and growth of the baby.  · Do not use any tobacco products, including cigarettes, chewing tobacco, and electronic cigarettes. If you need help quitting, ask your health care provider. You may receive counseling support and other resources to help you quit.  · Schedule a dentist appointment. At home, brush your teeth with a soft toothbrush and be gentle when you floss.  SEEK MEDICAL CARE IF:   · You have dizziness.  · You have mild pelvic cramps, pelvic pressure, or nagging pain in the abdominal area.  · You have persistent nausea, vomiting, or diarrhea.  · You have a bad smelling vaginal discharge.  · You have pain with urination.  · You notice increased swelling in your face, hands, legs, or ankles.  SEEK IMMEDIATE MEDICAL CARE IF:   · You have a fever.  · You are leaking fluid from your vagina.  · You have spotting or bleeding from your vagina.  · You have severe abdominal cramping or pain.  · You have rapid weight gain or loss.  · You vomit blood or material that looks like coffee grounds.  · You are exposed to German measles and have never had them.  · You are exposed to fifth disease or chickenpox.  · You develop a severe headache.  · You have shortness of breath.  · You have any kind of trauma, such as from a fall or a car accident.     This information is not intended to replace advice given to you by your health care provider. Make sure you discuss any questions you have with your health care provider.     Document Released: 05/12/2001 Document Revised: 06/08/2014 Document Reviewed: 03/28/2013  Elsevier Interactive Patient Education ©2017 [employer]

## 2016-06-10 NOTE — Progress Notes (Signed)
   PRENATAL VISIT NOTE  Subjective:  [name] OdorKelly C Dicker is a 31 y.o. G3P1001 at 7924w5d being seen today for ongoing prenatal care.  She is currently monitored for the following issues for this low-risk pregnancy and has Migraine without aura; Obesity affecting pregnancy, antepartum; and Supervision of normal pregnancy, antepartum on her problem list.  Patient reports no complaints.  Contractions: Not present. Vag. Bleeding: None.   . Denies leaking of fluid.   US showed IUP at 3724w5d GA with good fetal HR.   The following portions of the patient's history were reviewed and updated as appropriate: allergies, current medications, past family history, past medical history, past social history, past surgical history and problem list. Problem list updated.  Objective:   Vitals:   06/10/16 1121  BP: 129/88  Pulse: (!) 111  Weight: 208 lb (94.3 kg)    Fetal Status: Fetal Heart Rate (bpm): 156 (US)         General:  Alert, oriented and cooperative. Patient is in no acute distress.  Skin: Skin is warm and dry. No rash noted.   Cardiovascular: Normal heart rate noted  Respiratory: Normal respiratory effort, no problems with respiration noted  Abdomen: Soft, gravid, appropriate for gestational age. Pain/Pressure: Absent     Pelvic:  Cervical exam deferred        Extremities: Normal range of motion.     Mental Status: Normal mood and affect. Normal behavior. Normal judgment and thought content.   Assessment and Plan:  Pregnancy: G3P1001 at 7924w5d  1. Encounter for supervision of normal pregnancy, antepartum, unspecified gravidity - reviewed options for genetic screenings, declined all today -Reviewed practice model and group practice - Flu Vaccine QUAD 36+ mos IM (Fluarix, Quad PF) - Culture, OB Urine - Obstetric Panel, Including HIV - GC/Chlamydia probe amp ([place] - ToxASSURE Select 13 (MW), Urine  2. Obesity affecting pregnancy, antepartum - Recommended 11-15 labs TWG  -  TSH - Hemoglobin A1c  Preterm labor symptoms and general obstetric precautions including but not limited to vaginal bleeding, contractions, leaking of fluid and fetal movement were reviewed in detail with the patient. Please refer to After Visit Summary for other counseling recommendations.  Return in about 6 weeks (around 07/22/2016) for Routine prenatal care.   Federico FlakeKimberly Niles Raykwon Hobbs, MD

## 2016-06-11 LAB — SPECIMEN STATUS

## 2016-06-11 LAB — GC/CHLAMYDIA PROBE AMP (~~LOC~~) NOT AT ARMC
Chlamydia: NEGATIVE
Neisseria Gonorrhea: NEGATIVE

## 2016-06-11 LAB — GLUCOSE, RANDOM: GLUCOSE: 71 mg/dL (ref 65–99)

## 2016-06-12 LAB — OBSTETRIC PANEL, INCLUDING HIV
Antibody Screen: NEGATIVE
Basophils Absolute: 0 10*3/uL (ref 0.0–0.2)
Basos: 0 %
EOS (ABSOLUTE): 0 10*3/uL (ref 0.0–0.4)
Eos: 0 %
HIV Screen 4th Generation wRfx: NONREACTIVE
Hematocrit: 39.5 % (ref 34.0–46.6)
Hemoglobin: 13.1 g/dL (ref 11.1–15.9)
Hepatitis B Surface Ag: NEGATIVE
Immature Grans (Abs): 0 10*3/uL (ref 0.0–0.1)
Immature Granulocytes: 0 %
Lymphocytes Absolute: 1.9 10*3/uL (ref 0.7–3.1)
Lymphs: 18 %
MCH: 28.2 pg (ref 26.6–33.0)
MCHC: 33.2 g/dL (ref 31.5–35.7)
MCV: 85 fL (ref 79–97)
Monocytes Absolute: 0.6 10*3/uL (ref 0.1–0.9)
Monocytes: 6 %
Neutrophils Absolute: 7.8 10*3/uL — ABNORMAL HIGH (ref 1.4–7.0)
Neutrophils: 76 %
Platelets: 261 10*3/uL (ref 150–379)
RBC: 4.64 x10E6/uL (ref 3.77–5.28)
RDW: 13.6 % (ref 12.3–15.4)
RPR Ser Ql: NONREACTIVE
Rh Factor: POSITIVE
Rubella Antibodies, IGG: 2.88 index (ref >0.99)
WBC: 10.4 10*3/uL (ref 3.4–10.8)

## 2016-06-12 LAB — TSH: TSH: 3.53 u[IU]/mL (ref 0.450–4.500)

## 2016-06-12 LAB — HEMOGLOBIN A1C
Est. average glucose Bld gHb Est-mCnc: 94 mg/dL
Hgb A1c MFr Bld: 4.9 % (ref 4.8–5.6)

## 2016-06-14 LAB — CULTURE, OB URINE

## 2016-06-14 LAB — URINE CULTURE, OB REFLEX

## 2016-06-16 ENCOUNTER — Other Ambulatory Visit: Payer: Self-pay | Admitting: Family Medicine

## 2016-06-16 ENCOUNTER — Telehealth: Payer: Self-pay | Admitting: *Deleted

## 2016-06-16 DIAGNOSIS — O2340 Unspecified infection of urinary tract in pregnancy, unspecified trimester: Secondary | ICD-10-CM

## 2016-06-16 LAB — TOXASSURE SELECT 13 (MW), URINE

## 2016-06-16 MED ORDER — CEPHALEXIN 500 MG PO CAPS: 500.0000 mg | ORAL_CAPSULE | Freq: Four times a day (QID) | ORAL | 2 refills | Status: DC

## 2016-06-16 NOTE — Progress Notes (Signed)
Sent in Rx for kfelx based on sensitivities of K Pneumoniae species

## 2016-06-16 NOTE — Telephone Encounter (Signed)
-----   Message from Federico FlakeKimberly Niles Newton, MD sent at 06/16/2016 12:05 PM EST ----- K. Pneumoniae UTI. Sent in rx for Keflex. Please call patient to let her know to pick up medication. Prenatal labs otherwise unremarkable.

## 2016-06-16 NOTE — Telephone Encounter (Signed)
Called pt, no answer, left message about urine cx result and medication treatment.  Instructed to call back with any questions or concerns.

## 2016-07-22 ENCOUNTER — Encounter: Payer: Self-pay | Admitting: Obstetrics and Gynecology

## 2016-07-24 ENCOUNTER — Ambulatory Visit (INDEPENDENT_AMBULATORY_CARE_PROVIDER_SITE_OTHER): Payer: Self-pay | Admitting: Obstetrics & Gynecology

## 2016-07-24 VITALS — BP 120/82 | HR 94 | Wt 208.0 lb

## 2016-07-24 DIAGNOSIS — O2341 Unspecified infection of urinary tract in pregnancy, first trimester: Secondary | ICD-10-CM

## 2016-07-24 DIAGNOSIS — O2340 Unspecified infection of urinary tract in pregnancy, unspecified trimester: Secondary | ICD-10-CM | POA: Diagnosis not present

## 2016-07-24 DIAGNOSIS — Z3689 Encounter for other specified antenatal screening: Secondary | ICD-10-CM

## 2016-07-24 DIAGNOSIS — Z3490 Encounter for supervision of normal pregnancy, unspecified, unspecified trimester: Secondary | ICD-10-CM

## 2016-07-24 NOTE — Patient Instructions (Signed)
Second Trimester of Pregnancy The second trimester is from week 13 through week 28 (months 4 through 6). The second trimester is often a time when you feel your best. Your body has also adjusted to being pregnant, and you begin to feel better physically. Usually, morning sickness has lessened or quit completely, you may have more energy, and you may have an increase in appetite. The second trimester is also a time when the fetus is growing rapidly. At the end of the sixth month, the fetus is about 9 inches long and weighs about 1 pounds. You will likely begin to feel the baby move (quickening) between 18 and 20 weeks of the pregnancy. Body changes during your second trimester Your body continues to go through many changes during your second trimester. The changes vary from woman to woman.  Your weight will continue to increase. You will notice your lower abdomen bulging out.  You may begin to get stretch marks on your hips, abdomen, and breasts.  You may develop headaches that can be relieved by medicines. The medicines should be approved by your health care provider.  You may urinate more often because the fetus is pressing on your bladder.  You may develop or continue to have heartburn as a result of your pregnancy.  You may develop constipation because certain hormones are causing the muscles that push waste through your intestines to slow down.  You may develop hemorrhoids or swollen, bulging veins (varicose veins).  You may have back pain. This is caused by:  Weight gain.  Pregnancy hormones that are relaxing the joints in your pelvis.  A shift in weight and the muscles that support your balance.  Your breasts will continue to grow and they will continue to become tender.  Your gums may bleed and may be sensitive to brushing and flossing.  Dark spots or blotches (chloasma, mask of pregnancy) may develop on your face. This will likely fade after the baby is born.  A dark line  from your belly button to the pubic area (linea nigra) may appear. This will likely fade after the baby is born.  You may have changes in your hair. These can include thickening of your hair, rapid growth, and changes in texture. Some women also have hair loss during or after pregnancy, or hair that feels dry or thin. Your hair will most likely return to normal after your baby is born. What to expect at prenatal visits During a routine prenatal visit:  You will be weighed to make sure you and the fetus are growing normally.  Your blood pressure will be taken.  Your abdomen will be measured to track your baby's growth.  The fetal heartbeat will be listened to.  Any test results from the previous visit will be discussed. Your health care provider may ask you:  How you are feeling.  If you are feeling the baby move.  If you have had any abnormal symptoms, such as leaking fluid, bleeding, severe headaches, or abdominal cramping.  If you are using any tobacco products, including cigarettes, chewing tobacco, and electronic cigarettes.  If you have any questions. Other tests that may be performed during your second trimester include:  Blood tests that check for:  Low iron levels (anemia).  Gestational diabetes (between 24 and 28 weeks).  Rh antibodies. This is to check for a protein on red blood cells (Rh factor).  Urine tests to check for infections, diabetes, or protein in the urine.  An ultrasound to   confirm the proper growth and development of the baby.  An amniocentesis to check for possible genetic problems.  Fetal screens for spina bifida and Down syndrome.  HIV (human immunodeficiency virus) testing. Routine prenatal testing includes screening for HIV, unless you choose not to have this test. Follow these instructions at home: Eating and drinking  Continue to eat regular, healthy meals.  Avoid raw meat, uncooked cheese, cat litter boxes, and soil used by cats. These  carry germs that can cause birth defects in the baby.  Take your prenatal vitamins.  Take 1500-2000 mg of calcium daily starting at the 20th week of pregnancy until you deliver your baby.  If you develop constipation:  Take over-the-counter or prescription medicines.  Drink enough fluid to keep your urine clear or pale yellow.  Eat foods that are high in fiber, such as fresh fruits and vegetables, whole grains, and beans.  Limit foods that are high in fat and processed sugars, such as fried and sweet foods. Activity  Exercise only as directed by your health care provider. Experiencing uterine cramps is a good sign to stop exercising.  Avoid heavy lifting, wear low heel shoes, and practice good posture.  Wear your seat belt at all times when driving.  Rest with your legs elevated if you have leg cramps or low back pain.  Wear a good support bra for breast tenderness.  Do not use hot tubs, steam rooms, or saunas. Lifestyle  Avoid all smoking, herbs, alcohol, and unprescribed drugs. These chemicals affect the formation and growth of the baby.  Do not use any products that contain nicotine or tobacco, such as cigarettes and e-cigarettes. If you need help quitting, ask your health care provider.  A sexual relationship may be continued unless your health care provider directs you otherwise. General instructions  Follow your health care provider's instructions regarding medicine use. There are medicines that are either safe or unsafe to take during pregnancy.  Take warm sitz baths to soothe any pain or discomfort caused by hemorrhoids. Use hemorrhoid cream if your health care provider approves.  If you develop varicose veins, wear support hose. Elevate your feet for 15 minutes, 3-4 times a day. Limit salt in your diet.  Visit your dentist if you have not gone yet during your pregnancy. Use a soft toothbrush to brush your teeth and be gentle when you floss.  Keep all follow-up  prenatal visits as told by your health care provider. This is important. Contact a health care provider if:  You have dizziness.  You have mild pelvic cramps, pelvic pressure, or nagging pain in the abdominal area.  You have persistent nausea, vomiting, or diarrhea.  You have a bad smelling vaginal discharge.  You have pain with urination. Get help right away if:  You have a fever.  You are leaking fluid from your vagina.  You have spotting or bleeding from your vagina.  You have severe abdominal cramping or pain.  You have rapid weight gain or weight loss.  You have shortness of breath with chest pain.  You notice sudden or extreme swelling of your face, hands, ankles, feet, or legs.  You have not felt your baby move in over an hour.  You have severe headaches that do not go away with medicine.  You have vision changes. Summary  The second trimester is from week 13 through week 28 (months 4 through 6). It is also a time when the fetus is growing rapidly.  Your body goes   through many changes during pregnancy. The changes vary from woman to woman.  Avoid all smoking, herbs, alcohol, and unprescribed drugs. These chemicals affect the formation and growth your baby.  Do not use any tobacco products, such as cigarettes, chewing tobacco, and e-cigarettes. If you need help quitting, ask your health care provider.  Contact your health care provider if you have any questions. Keep all prenatal visits as told by your health care provider. This is important. This information is not intended to replace advice given to you by your health care provider. Make sure you discuss any questions you have with your health care provider. Document Released: 05/12/2001 Document Revised: 10/24/2015 Document Reviewed: 07/19/2012 [employer] Interactive Patient Education  2017 [employer]  

## 2016-07-24 NOTE — Progress Notes (Signed)
   PRENATAL VISIT NOTE  Subjective:  [name] [name] is a 31 y.o. G3P1011 at 546w0d being seen today for ongoing prenatal care.  She is currently monitored for the following issues for this low-risk pregnancy and has Migraine without aura; Obesity affecting pregnancy, antepartum; Supervision of normal pregnancy, antepartum; and Urinary tract infection affecting pregnancy, antepartum on her problem list.  Patient reports no complaints.  Contractions: Not present. Vag. Bleeding: None.  Movement: Absent. Denies leaking of fluid.   The following portions of the patient's history were reviewed and updated as appropriate: allergies, current medications, past family history, past medical history, past social history, past surgical history and problem list. Problem list updated.  Objective:   Vitals:   07/24/16 0915  BP: 120/82  Pulse: 94  Weight: 208 lb (94.3 kg)    Fetal Status:     Movement: Absent     General:  Alert, oriented and cooperative. Patient is in no acute distress.  Skin: Skin is warm and dry. No rash noted.   Cardiovascular: Normal heart rate noted  Respiratory: Normal respiratory effort, no problems with respiration noted  Abdomen: Soft, gravid, appropriate for gestational age. Pain/Pressure: Absent     Pelvic:  Cervical exam deferred        Extremities: Normal range of motion.  Edema: None  Mental Status: Normal mood and affect. Normal behavior. Normal judgment and thought content.   Assessment and Plan:  Pregnancy: G3P1011 at 7546w0d  1. Urinary tract infection affecting pregnancy, antepartum Had Klebsiella UTI last visit, TOC today. - Culture, OB Urine  2. Encounter for fetal anatomic survey Anatomy scan ordered.  - US MFM OB COMP + 14 WK; Future  3. Encounter for supervision of normal pregnancy, antepartum, unspecified gravidity Declined all genetic screening. No other complaints or concerns.  Routine obstetric precautions reviewed. Please refer to After Visit  Summary for other counseling recommendations.  Return in about 7 weeks (around 09/11/2016) for OB Visit (20 week Babyscripts).   Tereso NewcomerUgonna A Velma Hanna, MD

## 2016-07-29 LAB — CULTURE, OB URINE

## 2016-07-29 LAB — URINE CULTURE, OB REFLEX

## 2016-09-04 ENCOUNTER — Other Ambulatory Visit: Payer: Self-pay | Admitting: Obstetrics & Gynecology

## 2016-09-04 ENCOUNTER — Ambulatory Visit (HOSPITAL_COMMUNITY)
Admission: RE | Admit: 2016-09-04 | Discharge: 2016-09-04 | Disposition: A | Discharge status: Home / Self Care | Payer: BLUE CROSS/BLUE SHIELD | Source: Ambulatory Visit | Attending: Obstetrics & Gynecology | Admitting: Obstetrics & Gynecology

## 2016-09-04 DIAGNOSIS — Z3689 Encounter for other specified antenatal screening: Secondary | ICD-10-CM

## 2016-09-04 DIAGNOSIS — Z3490 Encounter for supervision of normal pregnancy, unspecified, unspecified trimester: Secondary | ICD-10-CM

## 2016-09-04 DIAGNOSIS — O99212 Obesity complicating pregnancy, second trimester: Secondary | ICD-10-CM | POA: Diagnosis not present

## 2016-09-04 DIAGNOSIS — Z3A19 19 weeks gestation of pregnancy: Secondary | ICD-10-CM | POA: Diagnosis not present

## 2016-09-04 DIAGNOSIS — O358XX0 Maternal care for other (suspected) fetal abnormality and damage, not applicable or unspecified: Secondary | ICD-10-CM | POA: Diagnosis not present

## 2016-09-11 ENCOUNTER — Encounter: Payer: Self-pay | Admitting: Obstetrics and Gynecology

## 2016-09-14 ENCOUNTER — Ambulatory Visit (INDEPENDENT_AMBULATORY_CARE_PROVIDER_SITE_OTHER): Payer: BLUE CROSS/BLUE SHIELD | Admitting: Obstetrics & Gynecology

## 2016-09-14 DIAGNOSIS — Z3482 Encounter for supervision of other normal pregnancy, second trimester: Secondary | ICD-10-CM

## 2016-09-14 DIAGNOSIS — Z3480 Encounter for supervision of other normal pregnancy, unspecified trimester: Secondary | ICD-10-CM

## 2016-09-14 NOTE — Patient Instructions (Addendum)
Return to clinic for any scheduled appointments or obstetric concerns, or go to [facility] for evaluation  Tdap Vaccine (Tetanus, Diphtheria and Pertussis): What You Need to Know 1. Why get vaccinated? Tetanus, diphtheria and pertussis are very serious diseases. Tdap vaccine can protect us from these diseases. And, Tdap vaccine given to pregnant women can protect newborn babies against pertussis. TETANUS (Lockjaw) is rare in the [place] today. It causes painful muscle tightening and stiffness, usually all over the body.  It can lead to tightening of muscles in the head and neck so you can't open your mouth, swallow, or sometimes even breathe. Tetanus kills about 1 out of 10 people who are infected even after receiving the best medical care.  DIPHTHERIA is also rare in the [place] today. It can cause a thick coating to form in the back of the throat.  It can lead to breathing problems, heart failure, paralysis, and death.  PERTUSSIS (Whooping Cough) causes severe coughing spells, which can cause difficulty breathing, vomiting and disturbed sleep.  It can also lead to weight loss, incontinence, and rib fractures. Up to 2 in 100 adolescents and 5 in 100 adults with pertussis are hospitalized or have complications, which could include pneumonia or death.  These diseases are caused by bacteria. Diphtheria and pertussis are spread from person to person through secretions from coughing or sneezing. Tetanus enters the body through cuts, scratches, or wounds. Before vaccines, as many as 200,000 cases of diphtheria, 200,000 cases of pertussis, and hundreds of cases of tetanus, were reported in the [place] each year. Since vaccination began, reports of cases for tetanus and diphtheria have dropped by about 99% and for pertussis by about 80%. 2. Tdap vaccine Tdap vaccine can protect adolescents and adults from tetanus, diphtheria, and pertussis. One dose of Tdap is routinely given at age 11 or 12.  People who did not get Tdap at that age should get it as soon as possible. Tdap is especially important for healthcare professionals and anyone having close contact with a baby younger than 12 months. Pregnant women should get a dose of Tdap during every pregnancy, to protect the newborn from pertussis. Infants are most at risk for severe, life-threatening complications from pertussis. Another vaccine, called Td, protects against tetanus and diphtheria, but not pertussis. A Td booster should be given every 10 years. Tdap may be given as one of these boosters if you have never gotten Tdap before. Tdap may also be given after a severe cut or burn to prevent tetanus infection. Your doctor or the person giving you the vaccine can give you more information. Tdap may safely be given at the same time as other vaccines. 3. Some people should not get this vaccine  A person who has ever had a life-threatening allergic reaction after a previous dose of any diphtheria, tetanus or pertussis containing vaccine, OR has a severe allergy to any part of this vaccine, should not get Tdap vaccine. Tell the person giving the vaccine about any severe allergies.  Anyone who had coma or long repeated seizures within 7 days after a childhood dose of DTP or DTaP, or a previous dose of Tdap, should not get Tdap, unless a cause other than the vaccine was found. They can still get Td.  Talk to your doctor if you: ? have seizures or another nervous system problem, ? had severe pain or swelling after any vaccine containing diphtheria, tetanus or pertussis, ? ever had a condition called Guillain-[name] Syndrome (GBS), ?   aren't feeling well on the day the shot is scheduled. 4. Risks With any medicine, including vaccines, there is a chance of side effects. These are usually mild and go away on their own. Serious reactions are also possible but are rare. Most people who get Tdap vaccine do not have any problems with it. Mild  problems following Tdap: (Did not interfere with activities)  Pain where the shot was given (about 3 in 4 adolescents or 2 in 3 adults)  Redness or swelling where the shot was given (about 1 person in 5)  Mild fever of at least 100.4F (up to about 1 in 25 adolescents or 1 in 100 adults)  Headache (about 3 or 4 people in 10)  Tiredness (about 1 person in 3 or 4)  Nausea, vomiting, diarrhea, stomach ache (up to 1 in 4 adolescents or 1 in 10 adults)  Chills, sore joints (about 1 person in 10)  Body aches (about 1 person in 3 or 4)  Rash, swollen glands (uncommon)  Moderate problems following Tdap: (Interfered with activities, but did not require medical attention)  Pain where the shot was given (up to 1 in 5 or 6)  Redness or swelling where the shot was given (up to about 1 in 16 adolescents or 1 in 12 adults)  Fever over 102F (about 1 in 100 adolescents or 1 in 250 adults)  Headache (about 1 in 7 adolescents or 1 in 10 adults)  Nausea, vomiting, diarrhea, stomach ache (up to 1 or 3 people in 100)  Swelling of the entire arm where the shot was given (up to about 1 in 500).  Severe problems following Tdap: (Unable to perform usual activities; required medical attention)  Swelling, severe pain, bleeding and redness in the arm where the shot was given (rare).  Problems that could happen after any vaccine:  People sometimes faint after a medical procedure, including vaccination. Sitting or lying down for about 15 minutes can help prevent fainting, and injuries caused by a fall. Tell your doctor if you feel dizzy, or have vision changes or ringing in the ears.  Some people get severe pain in the shoulder and have difficulty moving the arm where a shot was given. This happens very rarely.  Any medication can cause a severe allergic reaction. Such reactions from a vaccine are very rare, estimated at fewer than 1 in a million doses, and would happen within a few minutes to a few  hours after the vaccination. As with any medicine, there is a very remote chance of a vaccine causing a serious injury or death. The safety of vaccines is always being monitored. For more information, visit: www.cdc.gov/vaccinesafety/ 5. What if there is a serious problem? What should I look for? Look for anything that concerns you, such as signs of a severe allergic reaction, very high fever, or unusual behavior. Signs of a severe allergic reaction can include hives, swelling of the face and throat, difficulty breathing, a fast heartbeat, dizziness, and weakness. These would usually start a few minutes to a few hours after the vaccination. What should I do?  If you think it is a severe allergic reaction or other emergency that can't wait, call 9-1-1 or get the person to the nearest hospital. Otherwise, call your doctor.  Afterward, the reaction should be reported to the Vaccine Adverse Event Reporting System (VAERS). Your doctor might file this report, or you can do it yourself through the VAERS web site at www.vaers.hhs.gov, or by calling 1-800-822-7967. ?   VAERS does not give medical advice. 6. The National Vaccine Injury Compensation Program The National Vaccine Injury Compensation Program (VICP) is a federal program that was created to compensate people who may have been injured by certain vaccines. Persons who believe they may have been injured by a vaccine can learn about the program and about filing a claim by calling 1-800-338-2382 or visiting the VICP website at www.hrsa.gov/vaccinecompensation. There is a time limit to file a claim for compensation. 7. How can I learn more?  Ask your doctor. He or she can give you the vaccine package insert or suggest other sources of information.  Call your local or state health department.  Contact the Centers for Disease Control and Prevention (CDC): ? Call 1-800-232-4636 (1-800-CDC-INFO) or ? Visit CDC's website at www.cdc.gov/vaccines CDC Tdap  Vaccine VIS (07/25/13) This information is not intended to replace advice given to you by your health care provider. Make sure you discuss any questions you have with your health care provider. Document Released: 11/17/2011 Document Revised: 02/06/2016 Document Reviewed: 02/06/2016 Elsevier Interactive Patient Education  2017 [employer]   Third Trimester of Pregnancy The third trimester is from week 28 through week 40 (months 7 through 9). The third trimester is a time when the unborn baby (fetus) is growing rapidly. At the end of the ninth month, the fetus is about 20 inches in length and weighs 6-10 pounds. Body changes during your third trimester Your body will continue to go through many changes during pregnancy. The changes vary from woman to woman. During the third trimester:  Your weight will continue to increase. You can expect to gain 25-35 pounds (11-16 kg) by the end of the pregnancy.  You may begin to get stretch marks on your hips, abdomen, and breasts.  You may urinate more often because the fetus is moving lower into your pelvis and pressing on your bladder.  You may develop or continue to have heartburn. This is caused by increased hormones that slow down muscles in the digestive tract.  You may develop or continue to have constipation because increased hormones slow digestion and cause the muscles that push waste through your intestines to relax.  You may develop hemorrhoids. These are swollen veins (varicose veins) in the rectum that can itch or be painful.  You may develop swollen, bulging veins (varicose veins) in your legs.  You may have increased body aches in the pelvis, back, or thighs. This is due to weight gain and increased hormones that are relaxing your joints.  You may have changes in your hair. These can include thickening of your hair, rapid growth, and changes in texture. Some women also have hair loss during or after pregnancy, or hair that feels dry or  thin. Your hair will most likely return to normal after your baby is born.  Your breasts will continue to grow and they will continue to become tender. A yellow fluid (colostrum) may leak from your breasts. This is the first milk you are producing for your baby.  Your belly button may stick out.  You may notice more swelling in your hands, face, or ankles.  You may have increased tingling or numbness in your hands, arms, and legs. The skin on your belly may also feel numb.  You may feel short of breath because of your expanding uterus.  You may have more problems sleeping. This can be caused by the size of your belly, increased need to urinate, and an increase in your body's metabolism.    You may notice the fetus "dropping," or moving lower in your abdomen (lightening).  You may have increased vaginal discharge.  You may notice your joints feel loose and you may have pain around your pelvic bone.  What to expect at prenatal visits You will have prenatal exams every 2 weeks until week 36. Then you will have weekly prenatal exams. During a routine prenatal visit:  You will be weighed to make sure you and the baby are growing normally.  Your blood pressure will be taken.  Your abdomen will be measured to track your baby's growth.  The fetal heartbeat will be listened to.  Any test results from the previous visit will be discussed.  You may have a cervical check near your due date to see if your cervix has softened or thinned (effaced).  You will be tested for Group B streptococcus. This happens between 35 and 37 weeks.  Your health care provider may ask you:  What your birth plan is.  How you are feeling.  If you are feeling the baby move.  If you have had any abnormal symptoms, such as leaking fluid, bleeding, severe headaches, or abdominal cramping.  If you are using any tobacco products, including cigarettes, chewing tobacco, and electronic cigarettes.  If you have any  questions.  Other tests or screenings that may be performed during your third trimester include:  Blood tests that check for low iron levels (anemia).  Fetal testing to check the health, activity level, and growth of the fetus. Testing is done if you have certain medical conditions or if there are problems during the pregnancy.  Nonstress test (NST). This test checks the health of your baby to make sure there are no signs of problems, such as the baby not getting enough oxygen. During this test, a belt is placed around your belly. The baby is made to move, and its heart rate is monitored during movement.  What is false labor? False labor is a condition in which you feel small, irregular tightenings of the muscles in the womb (contractions) that usually go away with rest, changing position, or drinking water. These are called Braxton Hicks contractions. Contractions may last for hours, days, or even weeks before true labor sets in. If contractions come at regular intervals, become more frequent, increase in intensity, or become painful, you should see your health care provider. What are the signs of labor?  Abdominal cramps.  Regular contractions that start at 10 minutes apart and become stronger and more frequent with time.  Contractions that start on the top of the uterus and spread down to the lower abdomen and back.  Increased pelvic pressure and dull back pain.  A watery or bloody mucus discharge that comes from the vagina.  Leaking of amniotic fluid. This is also known as your "water breaking." It could be a slow trickle or a gush. Let your health care provider know if it has a color or strange odor. If you have any of these signs, call your health care provider right away, even if it is before your due date. Follow these instructions at home: Medicines  Follow your health care provider's instructions regarding medicine use. Specific medicines may be either safe or unsafe to take  during pregnancy.  Take a prenatal vitamin that contains at least 600 micrograms (mcg) of folic acid.  If you develop constipation, try taking a stool softener if your health care provider approves. Eating and drinking  Eat a balanced diet that   includes fresh fruits and vegetables, whole grains, good sources of protein such as meat, eggs, or tofu, and low-fat dairy. Your health care provider will help you determine the amount of weight gain that is right for you.  Avoid raw meat and uncooked cheese. These carry germs that can cause birth defects in the baby.  If you have low calcium intake from food, talk to your health care provider about whether you should take a daily calcium supplement.  Eat four or five small meals rather than three large meals a day.  Limit foods that are high in fat and processed sugars, such as fried and sweet foods.  To prevent constipation: ? Drink enough fluid to keep your urine clear or pale yellow. ? Eat foods that are high in fiber, such as fresh fruits and vegetables, whole grains, and beans. Activity  Exercise only as directed by your health care provider. Most women can continue their usual exercise routine during pregnancy. Try to exercise for 30 minutes at least 5 days a week. Stop exercising if you experience uterine contractions.  Avoid heavy lifting.  Do not exercise in extreme heat or humidity, or at high altitudes.  Wear low-heel, comfortable shoes.  Practice good posture.  You may continue to have sex unless your health care provider tells you otherwise. Relieving pain and discomfort  Take frequent breaks and rest with your legs elevated if you have leg cramps or low back pain.  Take warm sitz baths to soothe any pain or discomfort caused by hemorrhoids. Use hemorrhoid cream if your health care provider approves.  Wear a good support bra to prevent discomfort from breast tenderness.  If you develop varicose veins: ? Wear support  pantyhose or compression stockings as told by your healthcare provider. ? Elevate your feet for 15 minutes, 3-4 times a day. Prenatal care  Write down your questions. Take them to your prenatal visits.  Keep all your prenatal visits as told by your health care provider. This is important. Safety  Wear your seat belt at all times when driving.  Make a list of emergency phone numbers, including numbers for family, friends, the hospital, and police and fire departments. General instructions  Avoid cat litter boxes and soil used by cats. These carry germs that can cause birth defects in the baby. If you have a cat, ask someone to clean the litter box for you.  Do not travel far distances unless it is absolutely necessary and only with the approval of your health care provider.  Do not use hot tubs, steam rooms, or saunas.  Do not drink alcohol.  Do not use any products that contain nicotine or tobacco, such as cigarettes and e-cigarettes. If you need help quitting, ask your health care provider.  Do not use any medicinal herbs or unprescribed drugs. These chemicals affect the formation and growth of the baby.  Do not douche or use tampons or scented sanitary pads.  Do not cross your legs for long periods of time.  To prepare for the arrival of your baby: ? Take prenatal classes to understand, practice, and ask questions about labor and delivery. ? Make a trial run to the hospital. ? Visit the hospital and tour the maternity area. ? Arrange for maternity or paternity leave through employers. ? Arrange for family and friends to take care of pets while you are in the hospital. ? Purchase a rear-facing car seat and make sure you know how to install it in your car. ?   Pack your hospital bag. ? Prepare the baby's nursery. Make sure to remove all pillows and stuffed animals from the baby's crib to prevent suffocation.  Visit your dentist if you have not gone during your pregnancy. Use a  soft toothbrush to brush your teeth and be gentle when you floss. Contact a health care provider if:  You are unsure if you are in labor or if your water has broken.  You become dizzy.  You have mild pelvic cramps, pelvic pressure, or nagging pain in your abdominal area.  You have lower back pain.  You have persistent nausea, vomiting, or diarrhea.  You have an unusual or bad smelling vaginal discharge.  You have pain when you urinate. Get help right away if:  Your water breaks before 37 weeks.  You have regular contractions less than 5 minutes apart before 37 weeks.  You have a fever.  You are leaking fluid from your vagina.  You have spotting or bleeding from your vagina.  You have severe abdominal pain or cramping.  You have rapid weight loss or weight gain.  You have shortness of breath with chest pain.  You notice sudden or extreme swelling of your face, hands, ankles, feet, or legs.  Your baby makes fewer than 10 movements in 2 hours.  You have severe headaches that do not go away when you take medicine.  You have vision changes. Summary  The third trimester is from week 28 through week 40, months 7 through 9. The third trimester is a time when the unborn baby (fetus) is growing rapidly.  During the third trimester, your discomfort may increase as you and your baby continue to gain weight. You may have abdominal, leg, and back pain, sleeping problems, and an increased need to urinate.  During the third trimester your breasts will keep growing and they will continue to become tender. A yellow fluid (colostrum) may leak from your breasts. This is the first milk you are producing for your baby.  False labor is a condition in which you feel small, irregular tightenings of the muscles in the womb (contractions) that eventually go away. These are called Braxton Hicks contractions. Contractions may last for hours, days, or even weeks before true labor sets in.  Signs  of labor can include: abdominal cramps; regular contractions that start at 10 minutes apart and become stronger and more frequent with time; watery or bloody mucus discharge that comes from the vagina; increased pelvic pressure and dull back pain; and leaking of amniotic fluid. This information is not intended to replace advice given to you by your health care provider. Make sure you discuss any questions you have with your health care provider. Document Released: 05/12/2001 Document Revised: 10/24/2015 Document Reviewed: 07/19/2012 [employer] Interactive Patient Education  2017 [employer]    

## 2016-09-14 NOTE — Progress Notes (Signed)
PRENATAL VISIT NOTE  Subjective:  [name] [name] is a 31 y.o. G3P1011 at [redacted]w[redacted]d being seen today for ongoing prenatal care.  She is currently monitored for the following issues for this low-risk pregnancy and has Migraine without aura; Obesity affecting pregnancy, antepartum; and Supervision of normal pregnancy, antepartum on her problem list.  Patient reports no complaints.  Contractions: Not present. Vag. Bleeding: None.  Movement: Present. Denies leaking of fluid.   The following portions of the patient's history were reviewed and updated as appropriate: allergies, current medications, past family history, past medical history, past social history, past surgical history and problem list. Problem list updated.  Objective:   Vitals:   09/14/16 1315  BP: 125/86  Pulse: (!) 109  Weight: 208 lb (94.3 kg)    Fetal Status: Fetal Heart Rate (bpm): 156   Movement: Present     General:  Alert, oriented and cooperative. Patient is in no acute distress.  Skin: Skin is warm and dry. No rash noted.   Cardiovascular: Normal heart rate noted  Respiratory: Normal respiratory effort, no problems with respiration noted  Abdomen: Soft, gravid, appropriate for gestational age. Pain/Pressure: Present     Pelvic:  Cervical exam deferred        Extremities: Normal range of motion.  Edema: None  Mental Status: Normal mood and affect. Normal behavior. Normal judgment and thought content.   [place] Mfm Ob Detail +14 Wk  Result Date: 09/04/2016 ----------------------------------------------------------------------  OBSTETRICS REPORT                      (Signed Final 09/04/2016 02:37 pm) ---------------------------------------------------------------------- Patient Info  ID #:       [id number]                          D.O.B.:  06-24-1985 (31 yrs)  Name:       [name] [name]                   Visit Date: 09/04/2016 01:08 pm ---------------------------------------------------------------------- Performed By  Performed  By:     Tommi Emery         Ref. Address:     [address]                                                             [place], [place]                                                             [address]  Attending:  Rema Fendt MD      Location:         [facility]  Referred By:      Tereso Newcomer MD ---------------------------------------------------------------------- Orders   #  Description                                 Code   1  [place] MFM OB DETAIL +14 WK                     76811.01  ----------------------------------------------------------------------   #  Ordered By               Order #        Accession #    Episode #   1  Jaynie Collins           [id number]      [id number]     [id number]  ---------------------------------------------------------------------- Indications   [redacted] weeks gestation of pregnancy                Z3A.19   Encounter for antenatal screening for          Z36.3   malformations   Obesity complicating pregnancy, second         O99.212   trimester   Echogenic intracardiac focus of the heart      O35.8XX0   (EIF)  ---------------------------------------------------------------------- OB History  Blood Type:            Height:  5'2"   Weight (lb):  206       BMI:  37.67  Gravidity:    3         Term:   1        Prem:   0        SAB:   1  TOP:          0       Ectopic:  0        Living: 1 ---------------------------------------------------------------------- Fetal Evaluation  Num Of Fetuses:     1  Fetal Heart         142  Rate(bpm):  Cardiac Activity:   Observed  Presentation:       Cephalic  Placenta:           Posterior, above cervical os  P. Cord Insertion:  Visualized  Amniotic Fluid  AFI FV:      Subjectively within normal limits                              Largest Pocket(cm)                              2.95  ---------------------------------------------------------------------- Biometry  BPD:      47.8  mm     G. Age:  20w 3d         95  %    CI:        77.09   %    70 - 86  FL/HC:      17.3   %    16.1 - 18.3  HC:      172.4  mm     G. Age:  19w 6d         79  %    HC/AC:      1.21        1.09 - 1.39  AC:      142.9  mm     G. Age:  19w 4d         67  %    FL/BPD:     62.3   %  FL:       29.8  mm     G. Age:  19w 2d         50  %    FL/AC:      20.9   %    20 - 24  HUM:      29.1  mm     G. Age:  19w 3d         62  %  CER:      20.3  mm     G. Age:  19w 2d         57  %  NFT:       3.9  mm  CM:        4.9  mm  Est. FW:     296  gm    0 lb 10 oz      53  % ---------------------------------------------------------------------- Gestational Age  LMP:           20w 1d        Date:  04/16/16                 EDD:   01/21/17  U/S Today:     19w 6d                                        EDD:   01/23/17  Best:          19w 0d     Det. By:  Marcella Dubs         EDD:   01/29/17                                      (06/10/16) ---------------------------------------------------------------------- Anatomy  Cranium:               Appears normal         Aortic Arch:            Appears normal  Cavum:                 Appears normal         Ductal Arch:            Appears normal  Ventricles:            Appears normal         Diaphragm:              Appears normal  Choroid Plexus:        Appears normal         Stomach:                Appears normal,  left                                                                        sided  Cerebellum:            Appears normal         Abdomen:                Appears normal  Posterior Fossa:       Appears normal         Abdominal Wall:         Appears nml (cord                                                                        insert, abd wall)  Nuchal Fold:           Appears normal         Cord Vessels:           Appears normal (3                                                                         vessel cord)  Face:                  Appears normal         Kidneys:                Appear normal                         (orbits and profile)  Lips:                  Appears normal         Bladder:                Appears normal  Thoracic:              Appears normal         Spine:                  Appears normal  Heart:                 Echogenic focus        Upper Extremities:      Appears normal                         in LV  RVOT:                  Appears normal         Lower Extremities:      Appears normal  LVOT:  Appears normal  Other:  Fetus appears to be a female. Technically difficult due to maternal          habitus and fetal position. ---------------------------------------------------------------------- Cervix Uterus Adnexa  Cervix  Length:           3.92  cm.  Normal appearance by transabdominal scan.  Uterus  No abnormality visualized.  Left Ovary  Within normal limits.  Right Ovary  Within normal limits. ---------------------------------------------------------------------- Comments  An echogenic focus was seen in the left cardiac ventricle.  However, no gross fetal anomalies or other soft markers of  aneuploidy were identified.   An echogenic intracardiac focus  is felt to represent a calcified papillary muscle, and is not  associated with structural or functional cardiac abnormalities.  Although an echogenic cardiac focus may be associated with  an increased risk of Down syndrome, this risk is felt to be  minimal, especially when it is seen as an isolated finding. ---------------------------------------------------------------------- Impression  Single living intrauterine pregnancy at 19 weeks 0 days.  Appropriate fetal growth (53%).  Normal amniotic fluid volume.  The fetal anatomic survey is complete.  Normal fetal anatomy.  No fetal anomalies or soft markers of aneuploidy seen.  ----------------------------------------------------------------------                 Rema Fendt, MD Electronically Signed Final Report   09/04/2016 02:37 pm ----------------------------------------------------------------------   Assessment and Plan:  Pregnancy: G3P1011 at [redacted]w[redacted]d  1. Supervision of other normal pregnancy, antepartum Normal anatomy scan.  Reassured that LVEIF was isolated, and not of clinical significance. Declines genetic screening tests. Preterm labor symptoms and general obstetric precautions including but not limited to vaginal bleeding, contractions, leaking of fluid and fetal movement were reviewed in detail with the patient. Please refer to After Visit Summary for other counseling recommendations.  Return in about 8 weeks (around 11/09/2016) for 2 hr GTT, 3rd trimester labs, TDap, OB 28 week Babyscripts Visit.   Tereso Newcomer, MD

## 2016-10-06 ENCOUNTER — Telehealth: Payer: Self-pay | Admitting: *Deleted

## 2016-10-06 NOTE — Telephone Encounter (Signed)
Spoke to pt, recommended good hand washing and to wear a mask if possible when in close proximity.  Instructed to call back is she starts to become symptomatic and we could send the Tamiflu in at that time.  Pt acknowledged instructions.

## 2016-10-06 NOTE — Telephone Encounter (Signed)
-----   Message from Lindell SparHeather L Bacon, [place] sent at 10/06/2016 11:20 AM EDT ----- Regarding: pts child tested pos for A & B flu Contact: [phone] Pts child tested pos for A & B Flu and wants to know if she should take some tamiflu, for precaution? [facility] [place] if needed.

## 2016-11-10 ENCOUNTER — Encounter: Payer: BLUE CROSS/BLUE SHIELD | Admitting: Obstetrics and Gynecology

## 2016-11-11 ENCOUNTER — Ambulatory Visit (INDEPENDENT_AMBULATORY_CARE_PROVIDER_SITE_OTHER): Payer: BLUE CROSS/BLUE SHIELD | Admitting: Family Medicine

## 2016-11-11 VITALS — BP 106/76 | HR 98 | Wt 212.0 lb

## 2016-11-11 DIAGNOSIS — Z3483 Encounter for supervision of other normal pregnancy, third trimester: Secondary | ICD-10-CM

## 2016-11-11 DIAGNOSIS — Z3480 Encounter for supervision of other normal pregnancy, unspecified trimester: Secondary | ICD-10-CM

## 2016-11-11 DIAGNOSIS — Z23 Encounter for immunization: Secondary | ICD-10-CM | POA: Diagnosis not present

## 2016-11-11 DIAGNOSIS — O99210 Obesity complicating pregnancy, unspecified trimester: Secondary | ICD-10-CM

## 2016-11-11 DIAGNOSIS — O99213 Obesity complicating pregnancy, third trimester: Secondary | ICD-10-CM

## 2016-11-11 NOTE — Patient Instructions (Signed)

## 2016-11-11 NOTE — Progress Notes (Signed)
   PRENATAL VISIT NOTE  Subjective:  [name] [name] is a 31 y.o. G3P1011 at 5575w5d being seen today for ongoing prenatal care.  She is currently monitored for the following issues for this high-risk pregnancy and has Migraine without aura; Obesity affecting pregnancy, antepartum; and Supervision of normal pregnancy, antepartum on her problem list.  Patient reports no complaints.  Contractions: Not present. Vag. Bleeding: None.  Movement: Present. Denies leaking of fluid.   The following portions of the patient's history were reviewed and updated as appropriate: allergies, current medications, past family history, past medical history, past social history, past surgical history and problem list. Problem list updated.  Objective:   Vitals:   11/11/16 0942  BP: 106/76  Pulse: 98  Weight: 212 lb (96.2 kg)    Fetal Status: Fetal Heart Rate (bpm): 143   Movement: Present     General:  Alert, oriented and cooperative. Patient is in no acute distress.  Skin: Skin is warm and dry. No rash noted.   Cardiovascular: Normal heart rate noted  Respiratory: Normal respiratory effort, no problems with respiration noted  Abdomen: Soft, gravid, appropriate for gestational age. Pain/Pressure: Absent     Pelvic:  Cervical exam deferred        Extremities: Normal range of motion.  Edema: Trace  Mental Status: Normal mood and affect. Normal behavior. Normal judgment and thought content.   Assessment and Plan:  Pregnancy: G3P1011 at 2475w5d  1. Encounter for supervision of other normal pregnancy in third trimester - HIV antibody - RPR - CBC - Glucose Tolerance, 2 Hours w/1 Hour - Tdap vaccine greater than or equal to 7yo IM  2. Supervision of other normal pregnancy, antepartum UTD  3. Obesity affecting pregnancy, antepartum Reviewed weight gain in pregnancy, TWG 12 lbs  Recommend 11-15 TWG  Preterm labor symptoms and general obstetric precautions including but not limited to vaginal bleeding,  contractions, leaking of fluid and fetal movement were reviewed in detail with the patient. Please refer to After Visit Summary for other counseling recommendations.  Return in about 4 weeks (around 12/09/2016) for Routine prenatal care- @32  wks.   Federico FlakeKimberly Niles Newton, MD

## 2016-11-12 LAB — GLUCOSE TOLERANCE, 2 HOURS W/ 1HR
Glucose, 1 hour: 163 mg/dL (ref 65–179)
Glucose, 2 hour: 111 mg/dL (ref 65–152)
Glucose, Fasting: 79 mg/dL (ref 65–91)

## 2016-11-12 LAB — CBC
Hematocrit: 34.3 % (ref 34.0–46.6)
Hemoglobin: 11.5 g/dL (ref 11.1–15.9)
MCH: 28.1 pg (ref 26.6–33.0)
MCHC: 33.5 g/dL (ref 31.5–35.7)
MCV: 84 fL (ref 79–97)
PLATELETS: 183 10*3/uL (ref 150–379)
RBC: 4.09 x10E6/uL (ref 3.77–5.28)
RDW: 14.3 % (ref 12.3–15.4)
WBC: 7.6 10*3/uL (ref 3.4–10.8)

## 2016-11-12 LAB — HIV ANTIBODY (ROUTINE TESTING W REFLEX): HIV Screen 4th Generation wRfx: NONREACTIVE

## 2016-11-12 LAB — RPR: RPR Ser Ql: NONREACTIVE

## 2016-12-03 ENCOUNTER — Ambulatory Visit (INDEPENDENT_AMBULATORY_CARE_PROVIDER_SITE_OTHER): Payer: BLUE CROSS/BLUE SHIELD | Admitting: Obstetrics & Gynecology

## 2016-12-03 VITALS — BP 135/84 | HR 106 | Wt 216.0 lb

## 2016-12-03 DIAGNOSIS — Z3480 Encounter for supervision of other normal pregnancy, unspecified trimester: Secondary | ICD-10-CM

## 2016-12-03 DIAGNOSIS — E669 Obesity, unspecified: Secondary | ICD-10-CM

## 2016-12-03 DIAGNOSIS — O99210 Obesity complicating pregnancy, unspecified trimester: Secondary | ICD-10-CM

## 2016-12-03 DIAGNOSIS — O99213 Obesity complicating pregnancy, third trimester: Secondary | ICD-10-CM

## 2016-12-03 NOTE — Progress Notes (Signed)
   PRENATAL VISIT NOTE  Subjective:  [name] [name] is a 31 y.o. G3P1011 at 8289w6d being seen today for ongoing prenatal care.  She is currently monitored for the following issues for this low-risk pregnancy and has Migraine without aura; Obesity affecting pregnancy, antepartum; and Supervision of normal pregnancy, antepartum on her problem list.  Patient reports no complaints.  Contractions: Not present. Vag. Bleeding: None.  Movement: Present. Denies leaking of fluid.   The following portions of the patient's history were reviewed and updated as appropriate: allergies, current medications, past family history, past medical history, past social history, past surgical history and problem list. Problem list updated.  Objective:   Vitals:   12/03/16 0923  BP: 135/84  Pulse: (!) 106  Weight: 216 lb (98 kg)    Fetal Status: Fetal Heart Rate (bpm): 143   Movement: Present     General:  Alert, oriented and cooperative. Patient is in no acute distress.  Skin: Skin is warm and dry. No rash noted.   Cardiovascular: Normal heart rate noted  Respiratory: Normal respiratory effort, no problems with respiration noted  Abdomen: Soft, gravid, appropriate for gestational age. Pain/Pressure: Absent     Pelvic:  Cervical exam deferred        Extremities: Normal range of motion.  Edema: Trace  Mental Status: Normal mood and affect. Normal behavior. Normal judgment and thought content.   Assessment and Plan:  Pregnancy: G3P1011 at 1489w6d  1. Obesity affecting pregnancy, antepartum   2. Supervision of other normal pregnancy, antepartum  Preterm labor symptoms and general obstetric precautions including but not limited to vaginal bleeding, contractions, leaking of fluid and fetal movement were reviewed in detail with the patient. Please refer to After Visit Summary for other counseling recommendations.  No Follow-up on file.   Allie BossierMyra C Milon Dethloff, MD

## 2016-12-03 NOTE — Progress Notes (Signed)
Routine

## 2016-12-30 ENCOUNTER — Ambulatory Visit (INDEPENDENT_AMBULATORY_CARE_PROVIDER_SITE_OTHER): Payer: BLUE CROSS/BLUE SHIELD | Admitting: Obstetrics & Gynecology

## 2016-12-30 VITALS — BP 113/87 | HR 99 | Wt 220.0 lb

## 2016-12-30 DIAGNOSIS — O99210 Obesity complicating pregnancy, unspecified trimester: Secondary | ICD-10-CM

## 2016-12-30 DIAGNOSIS — E669 Obesity, unspecified: Secondary | ICD-10-CM

## 2016-12-30 DIAGNOSIS — Z3483 Encounter for supervision of other normal pregnancy, third trimester: Secondary | ICD-10-CM

## 2016-12-30 DIAGNOSIS — Z3480 Encounter for supervision of other normal pregnancy, unspecified trimester: Secondary | ICD-10-CM

## 2016-12-30 DIAGNOSIS — O99213 Obesity complicating pregnancy, third trimester: Secondary | ICD-10-CM

## 2016-12-30 DIAGNOSIS — Z113 Encounter for screening for infections with a predominantly sexual mode of transmission: Secondary | ICD-10-CM | POA: Diagnosis not present

## 2016-12-30 LAB — OB RESULTS CONSOLE GBS: STREP GROUP B AG: POSITIVE

## 2016-12-30 LAB — OB RESULTS CONSOLE GC/CHLAMYDIA: Gonorrhea: NEGATIVE

## 2016-12-30 NOTE — Progress Notes (Signed)
   PRENATAL VISIT NOTE  Subjective:  [name] [name] is a 31 y.o. G3P1011 at 2920w5d being seen today for ongoing prenatal care.  She is currently monitored for the following issues for this low-risk pregnancy and has Migraine without aura; Obesity affecting pregnancy, antepartum; and Supervision of normal pregnancy, antepartum on her problem list.  Patient reports no complaints.  Contractions: Irregular. Vag. Bleeding: None.  Movement: Present. Denies leaking of fluid.   The following portions of the patient's history were reviewed and updated as appropriate: allergies, current medications, past family history, past medical history, past social history, past surgical history and problem list. Problem list updated.  Objective:   Vitals:   12/30/16 0909  BP: 113/87  Pulse: 99  Weight: 220 lb (99.8 kg)    Fetal Status: Fetal Heart Rate (bpm): 140   Movement: Present     General:  Alert, oriented and cooperative. Patient is in no acute distress.  Skin: Skin is warm and dry. No rash noted.   Cardiovascular: Normal heart rate noted  Respiratory: Normal respiratory effort, no problems with respiration noted  Abdomen: Soft, gravid, appropriate for gestational age.  Pain/Pressure: Present     Pelvic: Cervical exam performed        Extremities: Normal range of motion.  Edema: Trace  Mental Status:  Normal mood and affect. Normal behavior. Normal judgment and thought content.   Assessment and Plan:  Pregnancy: G3P1011 at 2980w5d  1. Obesity affecting pregnancy, antepartum   2. Supervision of other normal pregnancy, antepartum   Preterm labor symptoms and general obstetric precautions including but not limited to vaginal bleeding, contractions, leaking of fluid and fetal movement were reviewed in detail with the patient. Please refer to After Visit Summary for other counseling recommendations.  No Follow-up on file.   Allie BossierMyra C Noretta Frier, MD

## 2017-01-01 LAB — CERVICOVAGINAL ANCILLARY ONLY
CHLAMYDIA, DNA PROBE: NEGATIVE
NEISSERIA GONORRHEA: NEGATIVE

## 2017-01-02 LAB — CULTURE, BETA STREP (GROUP B ONLY): STREP GP B CULTURE: POSITIVE — AB

## 2017-01-08 DIAGNOSIS — B951 Streptococcus, group B, as the cause of diseases classified elsewhere: Secondary | ICD-10-CM | POA: Insufficient documentation

## 2017-01-08 NOTE — Progress Notes (Signed)
BRX Compliant  

## 2017-01-13 ENCOUNTER — Ambulatory Visit (INDEPENDENT_AMBULATORY_CARE_PROVIDER_SITE_OTHER): Payer: BLUE CROSS/BLUE SHIELD | Admitting: Student

## 2017-01-13 ENCOUNTER — Encounter: Payer: Self-pay | Admitting: *Deleted

## 2017-01-13 VITALS — BP 124/86 | HR 105 | Wt 224.0 lb

## 2017-01-13 DIAGNOSIS — Z3483 Encounter for supervision of other normal pregnancy, third trimester: Secondary | ICD-10-CM

## 2017-01-13 DIAGNOSIS — B951 Streptococcus, group B, as the cause of diseases classified elsewhere: Secondary | ICD-10-CM

## 2017-01-13 DIAGNOSIS — Z3480 Encounter for supervision of other normal pregnancy, unspecified trimester: Secondary | ICD-10-CM

## 2017-01-13 NOTE — Patient Instructions (Signed)

## 2017-01-13 NOTE — Progress Notes (Signed)
   PRENATAL VISIT NOTE  Subjective:  [name] [name] is a 31 y.o. G3P1011 at 1766w5d being seen today for ongoing prenatal care.  She is currently monitored for the following issues for this low-risk pregnancy and has Migraine without aura; Obesity affecting pregnancy, antepartum; Supervision of normal pregnancy, antepartum; and Positive GBS test on her problem list.  Patient reports no complaints.  Contractions: Irregular. Vag. Bleeding: None.  Movement: Present. Denies leaking of fluid.   The following portions of the patient's history were reviewed and updated as appropriate: allergies, current medications, past family history, past medical history, past social history, past surgical history and problem list. Problem list updated.  Objective:   Vitals:   01/13/17 1411  BP: 124/86  Pulse: (!) 105  Weight: 224 lb (101.6 kg)    Fetal Status: Fetal Heart Rate (bpm): 144 Fundal Height: 37 cm Movement: Present  Presentation: Vertex  General:  Alert, oriented and cooperative. Patient is in no acute distress.  Skin: Skin is warm and dry. No rash noted.   Cardiovascular: Normal heart rate noted  Respiratory: Normal respiratory effort, no problems with respiration noted  Abdomen: Soft, gravid, appropriate for gestational age.  Pain/Pressure: Present     Pelvic: Cervical exam performed Dilation: Closed      Extremities: Normal range of motion.  Edema: Trace  Mental Status:  Normal mood and affect. Normal behavior. Normal judgment and thought content.   Assessment and Plan:  Pregnancy: G3P1011 at 1766w5d  1. Supervision of other normal pregnancy, antepartum Patient doing well; no complaints.   2. Positive GBS test Planning for TX in labor.   Term labor symptoms and general obstetric precautions including but not limited to vaginal bleeding, contractions, leaking of fluid and fetal movement were reviewed in detail with the patient. Please refer to After Visit Summary for other counseling  recommendations.  Return in about 1 week (around 01/20/2017).   Marylene LandKathryn Lorraine [name], CNM

## 2017-01-14 ENCOUNTER — Encounter: Payer: Self-pay | Admitting: *Deleted

## 2017-01-21 ENCOUNTER — Ambulatory Visit (INDEPENDENT_AMBULATORY_CARE_PROVIDER_SITE_OTHER): Payer: BLUE CROSS/BLUE SHIELD | Admitting: Obstetrics and Gynecology

## 2017-01-21 VITALS — BP 125/86 | HR 112 | Wt 224.0 lb

## 2017-01-21 DIAGNOSIS — O99210 Obesity complicating pregnancy, unspecified trimester: Secondary | ICD-10-CM

## 2017-01-21 DIAGNOSIS — Z3483 Encounter for supervision of other normal pregnancy, third trimester: Secondary | ICD-10-CM

## 2017-01-21 DIAGNOSIS — O99213 Obesity complicating pregnancy, third trimester: Secondary | ICD-10-CM

## 2017-01-21 DIAGNOSIS — B951 Streptococcus, group B, as the cause of diseases classified elsewhere: Secondary | ICD-10-CM

## 2017-01-21 DIAGNOSIS — Z3480 Encounter for supervision of other normal pregnancy, unspecified trimester: Secondary | ICD-10-CM

## 2017-01-21 NOTE — Progress Notes (Signed)
   PRENATAL VISIT NOTE  Subjective:  [name] [name] is a 31 y.o. G3P1011 at [redacted]w[redacted]d being seen today for ongoing prenatal care.  She is currently monitored for the following issues for this low-risk pregnancy and has Migraine without aura; Obesity affecting pregnancy, antepartum; Supervision of normal pregnancy, antepartum; and Positive GBS test on her problem list.  Patient reports no complaints.  Contractions: Irregular. Vag. Bleeding: None.  Movement: Present. Denies leaking of fluid.   The following portions of the patient's history were reviewed and updated as appropriate: allergies, current medications, past family history, past medical history, past social history, past surgical history and problem list. Problem list updated.  Objective:   Vitals:   01/21/17 1119  BP: 125/86  Pulse: (!) 112  Weight: 224 lb (101.6 kg)    Fetal Status: Fetal Heart Rate (bpm): 128 Fundal Height: 37 cm Movement: Present  Presentation: Vertex  General:  Alert, oriented and cooperative. Patient is in no acute distress.  Skin: Skin is warm and dry. No rash noted.   Cardiovascular: Normal heart rate noted  Respiratory: Normal respiratory effort, no problems with respiration noted  Abdomen: Soft, gravid, appropriate for gestational age.  Pain/Pressure: Present     Pelvic: Cervical exam performed Dilation: Closed Effacement (%): Thick Station: -3  Extremities: Normal range of motion.  Edema: Trace  Mental Status:  Normal mood and affect. Normal behavior. Normal judgment and thought content.   Assessment and Plan:  Pregnancy: G3P1011 at [redacted]w[redacted]d  1. Positive GBS test Will provide prophylaxis in labor  2. Supervision of other normal pregnancy, antepartum Patient is doing well without complaints Will plan for IOL at 41 weeks if no SOL  3. Obesity affecting pregnancy, antepartum   Term labor symptoms and general obstetric precautions including but not limited to vaginal bleeding, contractions, leaking  of fluid and fetal movement were reviewed in detail with the patient. Please refer to After Visit Summary for other counseling recommendations.  Return in about 1 week (around 01/28/2017) for ROB.   Catalina Antigua, MD

## 2017-01-26 ENCOUNTER — Inpatient Hospital Stay (EMERGENCY_DEPARTMENT_HOSPITAL)
Admission: AD | Admit: 2017-01-26 | Discharge: 2017-01-26 | Disposition: A | Discharge status: Home / Self Care | Payer: BLUE CROSS/BLUE SHIELD | Source: Ambulatory Visit | Attending: Family Medicine | Admitting: Family Medicine

## 2017-01-26 ENCOUNTER — Encounter (HOSPITAL_COMMUNITY): Payer: Self-pay

## 2017-01-26 ENCOUNTER — Inpatient Hospital Stay (HOSPITAL_COMMUNITY)
Admission: AD | Admit: 2017-01-26 | Discharge: 2017-01-26 | Disposition: A | Discharge status: Home / Self Care | Payer: BLUE CROSS/BLUE SHIELD | Source: Ambulatory Visit | Attending: Family Medicine | Admitting: Family Medicine

## 2017-01-26 DIAGNOSIS — O99824 Streptococcus B carrier state complicating childbirth: Secondary | ICD-10-CM | POA: Diagnosis not present

## 2017-01-26 DIAGNOSIS — O479 False labor, unspecified: Secondary | ICD-10-CM | POA: Diagnosis not present

## 2017-01-26 DIAGNOSIS — Z831 Family history of other infectious and parasitic diseases: Secondary | ICD-10-CM | POA: Diagnosis not present

## 2017-01-26 DIAGNOSIS — Z8489 Family history of other specified conditions: Secondary | ICD-10-CM | POA: Diagnosis not present

## 2017-01-26 DIAGNOSIS — Z3493 Encounter for supervision of normal pregnancy, unspecified, third trimester: Secondary | ICD-10-CM | POA: Diagnosis not present

## 2017-01-26 DIAGNOSIS — O99214 Obesity complicating childbirth: Secondary | ICD-10-CM | POA: Diagnosis not present

## 2017-01-26 DIAGNOSIS — Z3A39 39 weeks gestation of pregnancy: Secondary | ICD-10-CM | POA: Diagnosis not present

## 2017-01-26 DIAGNOSIS — Z6841 Body Mass Index (BMI) 40.0 and over, adult: Secondary | ICD-10-CM | POA: Diagnosis not present

## 2017-01-26 DIAGNOSIS — Z0371 Encounter for suspected problem with amniotic cavity and membrane ruled out: Secondary | ICD-10-CM

## 2017-01-26 DIAGNOSIS — Z820 Family history of epilepsy and other diseases of the nervous system: Secondary | ICD-10-CM | POA: Diagnosis not present

## 2017-01-26 DIAGNOSIS — Z23 Encounter for immunization: Secondary | ICD-10-CM | POA: Diagnosis not present

## 2017-01-26 LAB — POCT FERN TEST: POCT Fern Test: NEGATIVE

## 2017-01-26 NOTE — MAU Note (Signed)
Pt. Removed from monitors to ambulate hallway, per provider. Recheck SVE in an hour or two. Fetal tracing reactive, pt. contracting every 5-7 minutes. Pt. Directed to come back to the unit if her water breaks or gets very uncomfortable. Spouse at the patient's side.

## 2017-01-26 NOTE — Discharge Instructions (Signed)

## 2017-01-26 NOTE — MAU Note (Signed)
Small leak of clear, pinkish fluid started this morning.  Some contractions.

## 2017-01-26 NOTE — MAU Note (Signed)
Pt. Has been contracting all day, contractions are every 6-7 mins apart. Pain 7/10. Positive for fetal movement, denies sudden gush of fluid, no vaginal bleeding noted. EFM applied - FHR 130s, Toco applied - abd. Soft.

## 2017-01-26 NOTE — MAU Provider Note (Signed)
S: Ms. [name] [name] is a 31 y.o. G3P1011 at [redacted]w[redacted]d  who presents to MAU today complaining of leaking of fluid. Reports episode of clear fluid that trickled down her leg x 1 episode. Leaking has not continued. Denies vaginal bleeding. Occasional contractions. Positive fetal movement.   O: BP 132/83 (BP Location: Right Arm)   Pulse 96   Temp 97.8 F (36.6 C) (Oral)   Resp 16   Wt 224 lb 12 oz (101.9 kg)   LMP 04/16/2016   SpO2 100%   BMI 41.11 kg/m  GENERAL: Well-developed, well-nourished female in no acute distress.  HEAD: Normocephalic, atraumatic.  CHEST: Normal effort of breathing, regular heart rate ABDOMEN: Soft, nontender, gravid PELVIC: Normal external female genitalia. Vagina is pink and rugated. Cervix with normal contour, no lesions. Normal discharge.  negative pooling.   Cervical exam:  Dilation: 3 Effacement (%): 70 Cervical Position: Posterior Station: -3 Presentation: Vertex Exam by:: n druebbisch rn   Fetal Monitoring: Baseline: 130 Variability: moderate Accelerations: 15x15 Decelerations: none Contractions: irr ctx  Results for orders placed or performed during the hospital encounter of 01/26/17 (from the past 24 hour(s))  POCT fern test     Status: None   Collection Time: 01/26/17 10:38 AM  Result Value Ref Range   POCT Fern Test Negative = intact amniotic membranes      A: [facility] at [redacted]w[redacted]d  Membranes intact  P: Discharge home Discussed reasons to return to MAU  Judeth Horn, NP 01/26/2017 10:26 AM

## 2017-01-26 NOTE — MAU Note (Signed)
..   I have communicated with Zetta Bills, MD and reviewed vital signs:  Vitals:   01/26/17 1739 01/26/17 1931  BP: (!) 144/91 131/78  Pulse: (!) 108 99  Resp: 20   Temp: 98.3 F (36.8 C)     Vaginal exam:  Dilation: 3.5 Effacement (%): 70 Cervical Position: Posterior Station: -3 Exam by:: Lafaye Mcelmurry, RN ,   Also reviewed contraction pattern and that non-stress test is reactive.  It has been documented that patient is contracting every 5-7 minutes with minimal cervical change over 1.5 hours not indicating active labor.  Patient denies any other complaints.  Based on this report provider has given order for discharge.  A discharge order and diagnosis entered by a provider.   Labor discharge instructions reviewed with patient.

## 2017-01-27 ENCOUNTER — Inpatient Hospital Stay (HOSPITAL_COMMUNITY)
Admission: AD | Admit: 2017-01-27 | Discharge: 2017-01-29 | DRG: 775 | Disposition: A | Discharge status: Home / Self Care | Payer: BLUE CROSS/BLUE SHIELD | Source: Ambulatory Visit | Attending: Family Medicine | Admitting: Family Medicine

## 2017-01-27 ENCOUNTER — Inpatient Hospital Stay (HOSPITAL_COMMUNITY): Payer: BLUE CROSS/BLUE SHIELD | Admitting: Anesthesiology

## 2017-01-27 ENCOUNTER — Ambulatory Visit (INDEPENDENT_AMBULATORY_CARE_PROVIDER_SITE_OTHER): Payer: BLUE CROSS/BLUE SHIELD | Admitting: Obstetrics & Gynecology

## 2017-01-27 ENCOUNTER — Encounter (HOSPITAL_COMMUNITY): Payer: Self-pay

## 2017-01-27 VITALS — BP 128/84 | HR 99 | Wt 223.0 lb

## 2017-01-27 DIAGNOSIS — Z3480 Encounter for supervision of other normal pregnancy, unspecified trimester: Secondary | ICD-10-CM

## 2017-01-27 DIAGNOSIS — O99214 Obesity complicating childbirth: Secondary | ICD-10-CM | POA: Diagnosis present

## 2017-01-27 DIAGNOSIS — O99210 Obesity complicating pregnancy, unspecified trimester: Secondary | ICD-10-CM

## 2017-01-27 DIAGNOSIS — Z6841 Body Mass Index (BMI) 40.0 and over, adult: Secondary | ICD-10-CM | POA: Diagnosis not present

## 2017-01-27 DIAGNOSIS — B951 Streptococcus, group B, as the cause of diseases classified elsewhere: Secondary | ICD-10-CM

## 2017-01-27 DIAGNOSIS — Z820 Family history of epilepsy and other diseases of the nervous system: Secondary | ICD-10-CM | POA: Diagnosis not present

## 2017-01-27 DIAGNOSIS — Z3A39 39 weeks gestation of pregnancy: Secondary | ICD-10-CM

## 2017-01-27 DIAGNOSIS — O99824 Streptococcus B carrier state complicating childbirth: Secondary | ICD-10-CM | POA: Diagnosis not present

## 2017-01-27 DIAGNOSIS — Z23 Encounter for immunization: Secondary | ICD-10-CM | POA: Diagnosis not present

## 2017-01-27 DIAGNOSIS — Z8489 Family history of other specified conditions: Secondary | ICD-10-CM | POA: Diagnosis not present

## 2017-01-27 DIAGNOSIS — Z831 Family history of other infectious and parasitic diseases: Secondary | ICD-10-CM | POA: Diagnosis not present

## 2017-01-27 DIAGNOSIS — Z3493 Encounter for supervision of normal pregnancy, unspecified, third trimester: Secondary | ICD-10-CM | POA: Diagnosis present

## 2017-01-27 LAB — CBC
HEMATOCRIT: 36.9 % (ref 36.0–46.0)
Hemoglobin: 12.4 g/dL (ref 12.0–15.0)
MCH: 27.4 pg (ref 26.0–34.0)
MCHC: 33.6 g/dL (ref 30.0–36.0)
MCV: 81.5 fL (ref 78.0–100.0)
PLATELETS: 221 10*3/uL (ref 150–400)
RBC: 4.53 MIL/uL (ref 3.87–5.11)
RDW: 14.1 % (ref 11.5–15.5)
WBC: 11.9 10*3/uL — ABNORMAL HIGH (ref 4.0–10.5)

## 2017-01-27 LAB — TYPE AND SCREEN
ABO/RH(D): A POS
Antibody Screen: NEGATIVE

## 2017-01-27 MED ORDER — ONDANSETRON HCL 4 MG PO TABS: 4.0000 mg | ORAL_TABLET | ORAL | Status: DC | PRN

## 2017-01-27 MED ORDER — WITCH HAZEL-GLYCERIN EX PADS: 1.0000 "application " | MEDICATED_PAD | CUTANEOUS | Status: DC | PRN

## 2017-01-27 MED ORDER — ZOLPIDEM TARTRATE 5 MG PO TABS
5.0000 mg | ORAL_TABLET | Freq: Every evening | ORAL | Status: DC | PRN
Start: 2017-01-27 — End: 2017-01-29

## 2017-01-27 MED ORDER — ONDANSETRON HCL 4 MG/2ML IJ SOLN: 4.0000 mg | Freq: Four times a day (QID) | INTRAMUSCULAR | Status: DC | PRN

## 2017-01-27 MED ORDER — BENZOCAINE-MENTHOL 20-0.5 % EX AERO
1.0000 "application " | INHALATION_SPRAY | CUTANEOUS | Status: DC | PRN
  Filled 2017-01-27: qty 56

## 2017-01-27 MED ORDER — SIMETHICONE 80 MG PO CHEW: 80.0000 mg | CHEWABLE_TABLET | ORAL | Status: DC | PRN

## 2017-01-27 MED ORDER — LACTATED RINGERS IV SOLN
500.0000 mL | Freq: Once | INTRAVENOUS | Status: AC
  Administered 2017-01-27: 500 mL via INTRAVENOUS

## 2017-01-27 MED ORDER — LACTATED RINGERS IV SOLN: 500.0000 mL | INTRAVENOUS | Status: DC | PRN

## 2017-01-27 MED ORDER — ONDANSETRON HCL 4 MG/2ML IJ SOLN: 4.0000 mg | INTRAMUSCULAR | Status: DC | PRN

## 2017-01-27 MED ORDER — TERBUTALINE SULFATE 1 MG/ML IJ SOLN
0.2500 mg | Freq: Once | INTRAMUSCULAR | Status: DC | PRN
  Filled 2017-01-27: qty 1

## 2017-01-27 MED ORDER — PHENYLEPHRINE 40 MCG/ML (10ML) SYRINGE FOR IV PUSH (FOR BLOOD PRESSURE SUPPORT)
80.0000 ug | PREFILLED_SYRINGE | INTRAVENOUS | Status: DC | PRN
  Filled 2017-01-27: qty 5

## 2017-01-27 MED ORDER — ACETAMINOPHEN 325 MG PO TABS: 650.0000 mg | ORAL_TABLET | ORAL | Status: DC | PRN

## 2017-01-27 MED ORDER — LACTATED RINGERS IV SOLN
500.0000 mL | Freq: Once | INTRAVENOUS | Status: AC
  Administered 2017-01-27: 250 mL via INTRAVENOUS

## 2017-01-27 MED ORDER — FENTANYL 2.5 MCG/ML BUPIVACAINE 1/10 % EPIDURAL INFUSION (WH - ANES)
14.0000 mL/h | INTRAMUSCULAR | Status: DC | PRN
  Administered 2017-01-27 (×2): 14 mL/h via EPIDURAL
  Filled 2017-01-27 (×2): qty 100

## 2017-01-27 MED ORDER — OXYTOCIN 40 UNITS IN LACTATED RINGERS INFUSION - SIMPLE MED
1.0000 m[IU]/min | INTRAVENOUS | Status: DC
  Administered 2017-01-27: 2 m[IU]/min via INTRAVENOUS

## 2017-01-27 MED ORDER — SENNOSIDES-DOCUSATE SODIUM 8.6-50 MG PO TABS
2.0000 | ORAL_TABLET | ORAL | Status: DC
  Administered 2017-01-28: 2 via ORAL
  Filled 2017-01-27: qty 2

## 2017-01-27 MED ORDER — EPHEDRINE 5 MG/ML INJ
10.0000 mg | INTRAVENOUS | Status: DC | PRN
  Filled 2017-01-27: qty 2

## 2017-01-27 MED ORDER — PRENATAL MULTIVITAMIN CH
1.0000 | ORAL_TABLET | Freq: Every day | ORAL | Status: DC
  Administered 2017-01-28 – 2017-01-29 (×2): 1 via ORAL
  Filled 2017-01-27 (×2): qty 1

## 2017-01-27 MED ORDER — IBUPROFEN 600 MG PO TABS
600.0000 mg | ORAL_TABLET | Freq: Four times a day (QID) | ORAL | Status: DC
  Administered 2017-01-28 – 2017-01-29 (×6): 600 mg via ORAL
  Filled 2017-01-27 (×6): qty 1

## 2017-01-27 MED ORDER — DIPHENHYDRAMINE HCL 50 MG/ML IJ SOLN
12.5000 mg | INTRAMUSCULAR | Status: DC | PRN
  Administered 2017-01-27: 12.5 mg via INTRAVENOUS
  Filled 2017-01-27: qty 1

## 2017-01-27 MED ORDER — COCONUT OIL OIL: 1.0000 "application " | TOPICAL_OIL | Status: DC | PRN

## 2017-01-27 MED ORDER — OXYTOCIN BOLUS FROM INFUSION
500.0000 mL | Freq: Once | INTRAVENOUS | Status: AC
  Administered 2017-01-27: 500 mL via INTRAVENOUS

## 2017-01-27 MED ORDER — DIPHENHYDRAMINE HCL 25 MG PO CAPS: 25.0000 mg | ORAL_CAPSULE | Freq: Four times a day (QID) | ORAL | Status: DC | PRN

## 2017-01-27 MED ORDER — OXYCODONE-ACETAMINOPHEN 5-325 MG PO TABS: 1.0000 | ORAL_TABLET | ORAL | Status: DC | PRN

## 2017-01-27 MED ORDER — OXYCODONE-ACETAMINOPHEN 5-325 MG PO TABS: 2.0000 | ORAL_TABLET | ORAL | Status: DC | PRN

## 2017-01-27 MED ORDER — LIDOCAINE HCL (PF) 1 % IJ SOLN
30.0000 mL | INTRAMUSCULAR | Status: DC | PRN
  Filled 2017-01-27: qty 30

## 2017-01-27 MED ORDER — LIDOCAINE HCL (PF) 1 % IJ SOLN
INTRAMUSCULAR | Status: DC | PRN
  Administered 2017-01-27: 2 mL via EPIDURAL
  Administered 2017-01-27: 3 mL via EPIDURAL
  Administered 2017-01-27: 5 mL via EPIDURAL

## 2017-01-27 MED ORDER — OXYTOCIN 40 UNITS IN LACTATED RINGERS INFUSION - SIMPLE MED
2.5000 [IU]/h | INTRAVENOUS | Status: DC
  Filled 2017-01-27: qty 1000

## 2017-01-27 MED ORDER — DIBUCAINE 1 % RE OINT: 1.0000 "application " | TOPICAL_OINTMENT | RECTAL | Status: DC | PRN

## 2017-01-27 MED ORDER — LACTATED RINGERS IV SOLN
INTRAVENOUS | Status: DC
  Administered 2017-01-27 (×2): via INTRAVENOUS

## 2017-01-27 MED ORDER — PHENYLEPHRINE 40 MCG/ML (10ML) SYRINGE FOR IV PUSH (FOR BLOOD PRESSURE SUPPORT)
80.0000 ug | PREFILLED_SYRINGE | INTRAVENOUS | Status: DC | PRN
  Filled 2017-01-27: qty 5
  Filled 2017-01-27: qty 10

## 2017-01-27 MED ORDER — SOD CITRATE-CITRIC ACID 500-334 MG/5ML PO SOLN: 30.0000 mL | ORAL | Status: DC | PRN

## 2017-01-27 MED ORDER — SODIUM CHLORIDE 0.9 % IV SOLN
2.0000 g | INTRAVENOUS | Status: DC
  Administered 2017-01-27 (×3): 2 g via INTRAVENOUS
  Filled 2017-01-27 (×7): qty 2000

## 2017-01-27 MED ORDER — TETANUS-DIPHTH-ACELL PERTUSSIS 5-2.5-18.5 LF-MCG/0.5 IM SUSP: 0.5000 mL | Freq: Once | INTRAMUSCULAR | Status: DC
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
## 2017-01-27 NOTE — Progress Notes (Signed)
Patient ID: [name] [name], female   DOB: 03/22/1986, 31 y.o.   MRN: [id number] Called to see patient who just SROMed  Clear fluid FHR stable and reactive with accel   Category I  UCs not tracing well  Dilation: 8.5 Effacement (%): 90 Station: -2 Presentation: Vertex Exam by:: middleton rn  Will not augment for now in effort to get 4 hours from Ampicillin dose.  Anticipate SVD

## 2017-01-27 NOTE — Anesthesia Preprocedure Evaluation (Signed)
Anesthesia Evaluation  Patient identified by MRN, date of birth, ID band Patient awake    Reviewed: Allergy & Precautions, NPO status , Patient's Chart, lab work & pertinent test results  Airway Mallampati: II  TM Distance: >3 FB Neck ROM: Full    Dental  (+) Teeth Intact, Dental Advisory Given   Pulmonary neg pulmonary ROS,    Pulmonary exam normal breath sounds clear to auscultation       Cardiovascular negative cardio ROS Normal cardiovascular exam Rhythm:Regular Rate:Normal     Neuro/Psych  Headaches,    GI/Hepatic negative GI ROS, Neg liver ROS,   Endo/Other  Morbid obesity  Renal/GU negative Renal ROS     Musculoskeletal negative musculoskeletal ROS (+)   Abdominal   Peds  Hematology negative hematology ROS (+) Plt 221k   Anesthesia Other Findings Day of surgery medications reviewed with the patient.  Reproductive/Obstetrics (+) Pregnancy                             Anesthesia Physical Anesthesia Plan  ASA: III  Anesthesia Plan: Epidural   Post-op Pain Management:    Induction:   PONV Risk Score and Plan: Treatment may vary due to age or medical condition  Airway Management Planned:   Additional Equipment:   Intra-op Plan:   Post-operative Plan:   Informed Consent: I have reviewed the patients History and Physical, chart, labs and discussed the procedure including the risks, benefits and alternatives for the proposed anesthesia with the patient or authorized representative who has indicated his/her understanding and acceptance.   Dental advisory given  Plan Discussed with:   Anesthesia Plan Comments: (Patient identified. Risks/Benefits/Options discussed with patient including but not limited to bleeding, infection, nerve damage, paralysis, failed block, incomplete pain control, headache, blood pressure changes, nausea, vomiting, reactions to medication both or  allergic, itching and postpartum back pain. Confirmed with bedside nurse the patient's most recent platelet count. Confirmed with patient that they are not currently taking any anticoagulation, have any bleeding history or any family history of bleeding disorders. Patient expressed understanding and wished to proceed. All questions were answered. )        Anesthesia Quick Evaluation

## 2017-01-27 NOTE — Progress Notes (Signed)
Patient ID: [name] [name], female   DOB: November 14, 1985, 31 y.o.   MRN: [id number] AROM dark green mec  Vitals:   01/27/17 1832 01/27/17 1851 01/27/17 1902 01/27/17 1923  BP: (!) 159/88 138/77 125/76   Pulse: 92 98 94   Resp:    18  Temp:      TempSrc:      Weight:      Height:       Cervix 7-8/90/-1/vtx  FHR stable  Will add Pitocin to augment labor

## 2017-01-27 NOTE — Progress Notes (Signed)
LABOR PROGRESS NOTE  [name] [name] is a 31 y.o. G3P1011 at 447w5d  admitted for active labor  Subjective: Doing well, feels pressure, comfortable with epidural  Objective: BP (!) 116/51   Pulse 95   Temp 98.6 F (37 C) (Oral)   Resp 18   Ht 5\' 2"  (1.575 m)   Wt 101.2 kg (223 lb)   LMP 04/16/2016   BMI 40.79 kg/m  or  Vitals:   01/27/17 1902 01/27/17 1923 01/27/17 1931 01/27/17 2004  BP: 125/76  (!) 110/58 (!) 116/51  Pulse: 94  91 95  Resp:  18 18 18   Temp:    98.6 F (37 C)  TempSrc:    Oral  Weight:      Height:         Dilation: 9 Dilation Complete Date: 01/27/17 Dilation Complete Time: 1850 Effacement (%): 90 Station: +1 Presentation: Vertex Exam by:: Jaclyn PrimeK. Wheatley RN, K. Anderson RN FHT: baseline 125, moderate variability, accels present, no decels Uterine activity: every 4-5 minutes  Labs: Lab Results  Component Value Date   WBC 11.9 (H) 01/27/2017   HGB 12.4 01/27/2017   HCT 36.9 01/27/2017   MCV 81.5 01/27/2017   PLT 221 01/27/2017    Patient Active Problem List   Diagnosis Date Noted  . Normal labor 01/27/2017  . Positive GBS test 01/08/2017  . Supervision of normal pregnancy, antepartum 06/10/2016  . Migraine without aura 07/19/2012  . Obesity affecting pregnancy, antepartum 07/19/2012    Assessment / Plan: 31 y.o. G3P1011 at 3847w5d here for normal labor  Labor: progressing naturally Fetal Wellbeing:  Cat 1 tracing Pain Control:  Epidural in place Anticipated MOD:  vaginal  Tillman SersAngela C Anshi Jalloh, DO PGY-2 8/29/20189:19 PM

## 2017-01-27 NOTE — Progress Notes (Signed)
She is here for a labor check as she has been to the MAU several times and has been sent home.

## 2017-01-27 NOTE — Progress Notes (Signed)
Pt making cervical change, so do not begin pitocin at this time, per Dr Freada BergeronKey and Artelia LarocheM Williams, CNM Decker Cogdell Lynder ParentsBrown Husna Krone, RN .01/27/2017 [phone]

## 2017-01-27 NOTE — Progress Notes (Addendum)
[name] [name] is a 31 y.o. G3P1011 at 3642w5d admitted for normal labor.   Subjective: Doing well.  Family at bedside.  Epidural controlling discomfort.   Objective: BP 133/72   Pulse 94   Temp 98.5 F (36.9 C) (Oral)   Resp 20   Ht 5\' 2"  (1.575 m)   Wt 101.2 kg (223 lb)   LMP 04/16/2016   BMI 40.79 kg/m  No intake/output data recorded. Total I/O In: -  Out: 500 [Urine:500]  FHT:  FHR: 140 bpm, variability: moderate,  accelerations:  Present,  decelerations:  Absent UC:   Not tracing well SVE:   Dilation: Lip/rim Effacement (%): 90 Station: 0 Exam by:: Dr Freada BergeronKey   Assessment / Plan:  Labor: Progressing normally ; no augmentation warranted at this time Preeclampsia:  N/A Fetal Wellbeing:  Category I Pain Control:  Epidural I/D:  GBS positive on PCN Anticipated MOD:  NSVD  [name] [name] 01/27/2017, 5:52 PM

## 2017-01-27 NOTE — Anesthesia Procedure Notes (Signed)
Epidural Patient location during procedure: OB Start time: 01/27/2017 10:54 AM End time: 01/27/2017 10:59 AM  Staffing Anesthesiologist: Cecile HearingURK, STEPHEN EDWARD Performed: anesthesiologist   Preanesthetic Checklist Completed: patient identified, pre-op evaluation, timeout performed, IV checked, risks and benefits discussed and monitors and equipment checked  Epidural Patient position: sitting Prep: DuraPrep Patient monitoring: blood pressure and continuous pulse ox Approach: midline Location: L3-L4 Injection technique: LOR air  Needle:  Needle type: Tuohy  Needle gauge: 17 G Needle length: 9 cm Needle insertion depth: 5.5 cm Catheter size: 19 Gauge Catheter at skin depth: 10.5 cm Test dose: negative and Other (1% Lidocaine)  Additional Notes Patient identified.  Risk benefits discussed including failed block, incomplete pain control, headache, nerve damage, paralysis, blood pressure changes, nausea, vomiting, reactions to medication both toxic or allergic, and postpartum back pain.  Patient expressed understanding and wished to proceed.  All questions were answered.  Sterile technique used throughout procedure and epidural site dressed with sterile barrier dressing. No paresthesia or other complications noted. The patient did not experience any signs of intravascular injection such as tinnitus or metallic taste in mouth nor signs of intrathecal spread such as rapid motor block. Please see nursing notes for vital signs. Reason for block:procedure for pain

## 2017-01-27 NOTE — Anesthesia Pain Management Evaluation Note (Signed)
  CRNA Pain Management Visit Note  Patient: [name] [name], 31 y.o., female  "Hello [facility] am a [facility]. We have an anesthesia team available at all times to provide care throughout the hospital, including epidural management and anesthesia for C-section. I don't know your plan for the delivery whether it a natural birth, water birth, IV sedation, nitrous supplementation, doula or epidural, but we want to meet your pain goals."   1.Was your pain managed to your expectations on prior hospitalizations?   Yes   2.What is your expectation for pain management during this hospitalization?     Epidural  3.How can we help you reach that goal? epidural  Record the patient's initial score and the patient's pain goal.   Pain: 0  Pain Goal: 4 [facility] wants you to be able to say your pain was always managed very well.  Madison HickmanGREGORY,[name] 01/27/2017

## 2017-01-27 NOTE — Progress Notes (Signed)
Patient ID: [name], female   DOB: 1985/12/26, 31 y.o.   MRN: [id number] Called to room for FHR deceleration  Vitals:   01/27/17 1100 01/27/17 1105 01/27/17 1110 01/27/17 1115  BP: 125/75 128/75 128/75 125/75  Pulse: 99 94 90 88  Resp:      Temp:      TempSrc:      Weight:      Height:       FHR down to 80s for several minutes Position changes effected improvement  Cervix unchanged but there is a bulging bag now  Will continue to observe to get some time in for ampicillin to work  Aviva SignsWilliams, Cayce Paschal L, CNM

## 2017-01-28 LAB — RPR: RPR Ser Ql: NONREACTIVE

## 2017-01-28 LAB — HIV ANTIBODY (ROUTINE TESTING W REFLEX): HIV Screen 4th Generation wRfx: NONREACTIVE

## 2017-01-28 NOTE — Lactation Note (Signed)
This note was copied from a baby's chart. Lactation Consultation Note Mom has 31 yr old that she tried to BF. Stated had difficulty latching and maintaining latching d/t flat nipples and large breast. Flat nipple at the bottom end of large pendulum breast. Not appropriate for NS. Mom states that is she BF her 1st child for a couple of days then switched to pumping and bottle feeding when her milk came in. Mom supplemented w/formula until her milk came in then just pump and bottle fed BM for 6 weeks. After returning to work became difficult to pump developed mastitis.  Mom plans on pumping and bottle feeding. Mom states she will latch the baby while here at hospital, but will also supplement w/formula until her milk comes in.   Mom shown how to use DEBP & how to disassemble, clean, & reassemble parts. Mom knows to pump q3h for 15-20 min. Hand expressed 1 ml colostrum to spoon feed baby.  Mom encouraged to feed baby 8-12 times/24 hours and with feeding cues. Reviewed newborn behavior, STS< I&O, cluster feeding, supply and demand.  When LC entered rm. Baby was crying, pushing away from breast. Mom holding baby in cradle position, not having good body alignment. Recommended football position. Elevated breast w/folded cloth. Sand witched breast into baby's mouth. Flat nipple everted to short shaft after much stimulation. Baby pushing back, wouldn't suckle on breast. W/gloved finger suck training, noted high narrow palate. Baby chomped w/uncoordinated occasional suck. Finally developed good rhythm. Latched and maintained latch for 15 min, occasionaly hearing audible swallow. Mom stated baby had latched to other breast for 15 min.   Discussed pumping and bottle feeding BM, breast massage, hands free bra, STS, milk storage, power pumping, preventing mastitis.  Reviewed supplementation amounts. Encouraged to BF then supplement w/BM from pumping and hand expression.   [facility]/LC brochure given w/resources, support  groups and LC services.  Patient Name: [name] [name] Today's Date: 01/28/2017 Reason for consult: Initial assessment   Maternal Data Has patient been taught Hand Expression?: Yes Does the patient have breastfeeding experience prior to this delivery?: Yes  Feeding Feeding Type: Breast Fed Length of feed: 30 min  LATCH Score Latch: Repeated attempts needed to sustain latch, nipple held in mouth throughout feeding, stimulation needed to elicit sucking reflex.  Audible Swallowing: A few with stimulation  Type of Nipple: Flat  Comfort (Breast/Nipple): Soft / non-tender  Hold (Positioning): Assistance needed to correctly position infant at breast and maintain latch.  LATCH Score: 6  Interventions Interventions: Breast feeding basics reviewed;Assisted with latch;Skin to skin;Breast massage;Hand express;Breast compression;Adjust position;DEBP;Position options;Support pillows;Expressed milk  Lactation Tools Discussed/Used Tools: Pump Breast pump type: Double-Electric Breast Pump Pump Review: Setup, frequency, and cleaning;Milk Storage Initiated by:: Peri JeffersonL. Zaylynn Rickett RN IBCLC Date initiated:: 01/28/17   Consult Status Consult Status: Follow-up Date: 01/28/17 Follow-up type: In-patient    Woodfin Kiss, Diamond NickelLAURA G 01/28/2017, 3:05 AM

## 2017-01-28 NOTE — Anesthesia Postprocedure Evaluation (Signed)
Anesthesia Post Note  Patient: [name] [name]  Procedure(s) Performed: * No procedures listed *     Patient location during evaluation: Mother Baby Anesthesia Type: Epidural Level of consciousness: awake and alert Pain management: pain level controlled Vital Signs Assessment: post-procedure vital signs reviewed and stable Respiratory status: spontaneous breathing Cardiovascular status: blood pressure returned to baseline Postop Assessment: no headache, no backache, epidural receding, adequate PO intake, no signs of nausea or vomiting and patient able to bend at knees Anesthetic complications: no    Last Vitals:  Vitals:   01/28/17 0058 01/28/17 0500  BP: 128/61 117/67  Pulse: 100 87  Resp: 14 16  Temp:  36.6 C  SpO2: 98% 99%    Last Pain:  Vitals:   01/28/17 0500  TempSrc: Oral  PainSc: 0-No pain   Pain Goal:                 Jeily Guthridge

## 2017-01-28 NOTE — Lactation Note (Signed)
This note was copied from a baby's chart. Lactation Consultation Note P2 mom holding infant in blankets with formula bottle opened at bedside table.  Mom states infant latches well but still is hungry after breastfeeding.   LC congratulates mom's success with infant latching;  as first child did not BF.  Mom stated she pumped and bottle fed first child BM for 6 weeks before switching to formula only.  Mom declines help or assistance at this time.  She has no questions for LC.  LC discussed importance of bf prior to formula feeding in order to keep breast stimulated in order to produce milk.  Mom understands.  LC encouraged mom to call out for further questions or assistance.     Patient Name: Girl Ok AnisKelly Balthaser WUJWJ'XToday's Date: 01/28/2017     Maternal Data    Feeding Feeding Type: Breast Fed Length of feed: 30 min  LATCH Score                   Interventions    Lactation Tools Discussed/Used     Consult Status      Maryruth HancockKelly Suzanne [facility] 01/28/2017, 3:22 PM

## 2017-01-28 NOTE — Progress Notes (Addendum)
[id number] Post Partum Day 1 from SVD without complications. Subjective: She has no complaints this morning.  She is breast and bottle feeding.  No pain or discomfort.  Lochia is minimal.  She is ambulating well.  Tolerating regular diet.  She desires OCPs for contraception until her husband receives vasectomy.  Objective: Blood pressure 117/67, pulse 87, temperature 97.9 F (36.6 C), temperature source Oral, resp. rate 16, height 5\' 2"  (1.575 m), weight 101.2 kg (223 lb), last menstrual period 04/16/2016, SpO2 99 %, unknown if currently breastfeeding.  Physical Exam:  General: alert and cooperative Lochia: appropriate Uterine Fundus: firm Incision: N/A DVT Evaluation: No evidence of DVT seen on physical exam.   Recent Labs  01/27/17 1015  HGB 12.4  HCT 36.9    Assessment/Plan: [id number] ppd1 from SVD without complications, doing well.   Routine postpartum care.  OCPs for contraception. Plan discharge tomorrow pending clinical progress.    LOS: 1 day   Jearld LeschMary K Key 01/28/2017, 7:12 AM    CNM attestation Post Partum Day #1 I have seen and examined this patient and agree with above documentation in the resident's note.   Phillips OdorKelly C Margulies is a 31 y.o. [id number] s/p SVD.  Pt denies problems with ambulating, voiding or po intake. Pain is well controlled.  Plan for birth control is oral progesterone-only contraceptive, vasectomy.  Method of Feeding: breast  PE:  BP 117/67 (BP Location: Right Arm)   Pulse 87   Temp 97.9 F (36.6 C) (Oral)   Resp 16   Ht 5\' 2"  (1.575 m)   Wt 101.2 kg (223 lb)   LMP 04/16/2016   SpO2 99%   Breastfeeding? Unknown   BMI 40.79 kg/m  Fundus firm  Plan for discharge: 01/29/17  Cam HaiSHAW, Jannette Cotham, CNM 10:14 AM 01/28/2017

## 2017-01-29 MED ORDER — IBUPROFEN 600 MG PO TABS: 600.0000 mg | ORAL_TABLET | Freq: Four times a day (QID) | ORAL | 0 refills | Status: DC

## 2017-01-29 MED ORDER — NORETHINDRONE 0.35 MG PO TABS: 1.0000 | ORAL_TABLET | Freq: Every day | ORAL | 11 refills | Status: DC

## 2017-01-29 NOTE — Lactation Note (Signed)
This note was copied from a baby's chart. Lactation Consultation Note  Patient Name: [name] [name] BJYNW'GToday's Date: 01/29/2017 Reason for consult: Follow-up assessment   Baby 37 hours old.  Mother is breastfeeding and baby is getting formula afterwards. Provided volume guidelines. Reviewed engorgement care and monitoring voids/stools. Mom encouraged to feed baby 8-12 times/24 hours and with feeding cues.     Maternal Data    Feeding Feeding Type: Breast Fed  LATCH Score Latch: Repeated attempts needed to sustain latch, nipple held in mouth throughout feeding, stimulation needed to elicit sucking reflex.  Audible Swallowing: A few with stimulation  Type of Nipple: Everted at rest and after stimulation  Comfort (Breast/Nipple): Filling, red/small blisters or bruises, mild/mod discomfort  Hold (Positioning): No assistance needed to correctly position infant at breast.  LATCH Score: 7  Interventions Interventions: Breast feeding basics reviewed;Hand express;Support pillows;Expressed milk  Lactation Tools Discussed/Used     Consult Status Consult Status: Complete    [name] [name], [name] [name] 01/29/2017, 10:46 AM

## 2017-01-29 NOTE — Lactation Note (Signed)
This note was copied from a baby's chart. Lactation Consultation Note F/u on BF. Mom resting. FOB holding baby. States when baby is laid down In crib wakes up and cries. Mom is BF then FOB is supplementing w/formula. [facility] Stating BF going well when she pump, nothing pumped, but pumping for stimulation. Mom stated baby has had 2 stools. One then shortly a big blow out. Encouraged mom to massage when BF. Praised for keeping good I&O and BF well.  Patient Name: [name] [name] ZOXWR'UToday's Date: 01/29/2017 Reason for consult: Follow-up assessment   Maternal Data    Feeding Feeding Type: Bottle Fed - Formula Length of feed: 20 min  LATCH Score                   Interventions    Lactation Tools Discussed/Used     Consult Status Consult Status: Follow-up Date: 01/29/17 Follow-up type: In-patient    [name], Diamond NickelLAURA G 01/29/2017, 5:14 AM

## 2017-01-29 NOTE — Discharge Summary (Signed)
OB Discharge Summary     Patient Name: [name] [name] DOB: June 20, 1985 MRN: [id number]  Date of admission: 01/27/2017 Delivering MD: [name] [name]   Date of discharge: 01/29/2017  Admitting diagnosis: LABOR Intrauterine pregnancy: 144w5d     Secondary diagnosis:  Active Problems:   Normal labor  Additional problems:      Discharge diagnosis: Term Pregnancy Delivered                                                                                                Post partum procedures:none  Augmentation: none  Complications: None  Hospital course:  Onset of Labor With Vaginal Delivery     31 y.o. yo [id number] at 414w5d was admitted in Active Labor on 01/27/2017. Patient had an uncomplicated labor course as follows:  Membrane Rupture Time/Date: 1:20 PM ,01/27/2017   Intrapartum Procedures: Episiotomy: None [1]                                         Lacerations:  None [1]  Patient had a delivery of a Viable infant. 01/27/2017  Information for the patient's newborn:  [name] [name] [[id number]][[id number]]  Delivery Method: Vaginal, Spontaneous Delivery (Filed from Delivery Summary)    Pateint had an uncomplicated postpartum course.  She is ambulating, tolerating a regular diet, passing flatus, and urinating well. Patient is discharged home in stable condition on 01/29/17.   Physical exam  Vitals:   01/27/17 2318 01/27/17 2348 01/28/17 0058 01/28/17 0500  BP: 115/63 128/66 128/61 117/67  Pulse: 100 97 100 87  Resp: 18 18 14 16   Temp:  99.5 F (37.5 [name])  97.9 F (36.6 [name])  TempSrc:  Axillary  Oral  SpO2:  97% 98% 99%  Weight:      Height:       General: alert, cooperative and no distress Lochia: appropriate Uterine Fundus: firm Incision: N/A DVT Evaluation: No evidence of DVT seen on physical exam. Negative Homan's sign. No cords or calf tenderness. Labs: Lab Results  Component Value Date   WBC 11.9 (H) 01/27/2017   HGB 12.4 01/27/2017   HCT 36.9 01/27/2017   MCV 81.5  01/27/2017   PLT 221 01/27/2017   CMP Latest Ref Rng & Units 06/10/2016  Glucose 65 - 99 mg/dL 71  BUN 7 - 25 mg/dL -  Creatinine 6.960.50 - 2.951.10 mg/dL -  Sodium 284135 - 132146 mmol/L -  Potassium 3.5 - 5.3 mmol/L -  Chloride 98 - 110 mmol/L -  CO2 20 - 31 mmol/L -  Calcium 8.6 - 10.2 mg/dL -  Total Protein 6.1 - 8.1 g/dL -  Total Bilirubin 0.2 - 1.2 mg/dL -  Alkaline Phos 33 - 440115 U/L -  AST 10 - 30 U/L -  ALT 6 - 29 U/L -    Discharge instruction: per After Visit Summary and "Baby and Me Booklet".  After visit meds:  Allergies as of 01/29/2017   No Known Allergies     Medication List  STOP taking these medications   calcium carbonate 500 MG chewable tablet Commonly known as:  TUMS - dosed in mg elemental calcium     TAKE these medications   ibuprofen 600 MG tablet Commonly known as:  ADVIL,MOTRIN Take 1 tablet (600 mg total) by mouth every 6 (six) hours.   JUICE PLUS FIBRE PO Take 2 tablets by mouth daily. There are 4 different ( vegatable, fruit, megsa fish oil and berry) juice bottles. Pt takes 2 tablets from each bottle.   norethindrone 0.35 MG tablet Commonly known as:  ORTHO MICRONOR Take 1 tablet (0.35 mg total) by mouth daily.            Discharge Care Instructions        Start     Ordered   01/29/17 0000  ibuprofen (ADVIL,MOTRIN) 600 MG tablet  Every 6 hours    Question:  Supervising Provider  Answer:  [name] [name]   01/29/17 9147   01/29/17 0000  norethindrone (ORTHO MICRONOR) 0.35 MG tablet  Daily    Comments:  Start pills on the Sunday when the baby is 60 weeks old  Question:  Supervising Provider  Answer:  [name] [name]   01/29/17 0807   01/27/17 0000  OB RESULT CONSOLE Group B Strep    Comments:  This external order was created through the Results Console.   01/27/17 1939   01/27/17 0000  OB RESULTS CONSOLE GC/Chlamydia    Comments:  This external order was created through the Results Console.   01/27/17 1939      Diet:  routine diet  Activity: Advance as tolerated. Pelvic rest for 6 weeks.   Outpatient follow up:4 weeks Follow up Appt:No future appointments. Follow up Visit:No Follow-up on file.  Postpartum contraception: Progesterone only pills  Newborn Data: Live born female  Birth Weight: 7 lb 1.9 oz (3230 g) APGAR: 9, 10  Baby Feeding: Bottle and Breast Disposition:home with mother   01/29/2017 Greig Right, CNM

## 2017-01-29 NOTE — Progress Notes (Signed)
Post Partum Day 2  Subjective:  [name] [name] is a 31 y.o. [id number] 3956w5d s/p SVD.  No acute events overnight.  Pt denies problems with ambulating, voiding or po intake.  She denies nausea or vomiting.  Pain is well controlled.  She has had flatus. She has not had bowel movement.  Lochia Small.  Plan for birth control is POPs- husband is getting vasectomy.  Method of Feeding: breast and bottle feeding while milk comes in  Objective: BP 117/67 (BP Location: Right Arm)   Pulse 87   Temp 97.9 F (36.6 C) (Oral)   Resp 16   Ht 5\' 2"  (1.575 m)   Wt 101.2 kg (223 lb)   LMP 04/16/2016   SpO2 99%   Breastfeeding and bottle  BMI 40.79 kg/m   Physical Exam:  General: alert, cooperative and no distress Lochia:normal flow Chest: CTAB Heart: RRR no m/r/g Abdomen: +BS, soft, nontender, fundus firm at/below umbilicus Uterine Fundus: firm, below umbilicous DVT Evaluation: No evidence of DVT seen on physical exam. Extremities: mild edema   Recent Labs  01/27/17 1015  HGB 12.4  HCT 36.9    Assessment/Plan:  ASSESSMENT: [name] OdorKelly C Youman is a 31 y.o. [id number] 8056w5d ppd #2 s/p SVD doing well.   Discharge home   LOS: 2 days   Obie DredgeNelly TWeledji Medical Student 01/29/2017, 7:18 AM     I have seen and examined this patient and agree with the management plan.

## 2017-01-29 NOTE — Plan of Care (Signed)
Problem: Nutritional: Goal: Mothers verbalization of comfort with breastfeeding process will improve Outcome: Completed/Met Date Met: 01/29/17 Pt putting the baby to the breast and then supplementing with Alimentum via bottle afterwards.  Pt reports she is pumping but is not getting out enough to supplement baby.  Pt reports her nipples are a little sore.  Hand expression of breastmilk onto nipple and ensuring a proper latch.  No bruising or breakdown noted on the nipple or areola during assessment.  Pt latched the baby in the football position and FOB assisted with adding support pillows and blankets. Baby open wide and pt made a sandwich with her tissue and gave it to baby.  Encouraged and demonstrated to pt to check baby's bottom lip to make sure it is flanged out.  Pt verbalized understanding.

## 2017-01-29 NOTE — Discharge Instructions (Signed)
Postpartum Care After Vaginal Delivery °The period of time right after you deliver your newborn is called the postpartum period. °What kind of medical care will I receive? °· You may continue to receive fluids and medicines through an IV tube inserted into one of your veins. °· If an incision was made near your vagina (episiotomy) or if you had some vaginal tearing during delivery, cold compresses may be placed on your episiotomy or your tear. This helps to reduce pain and swelling. °· You may be given a squirt bottle to use when you go to the bathroom. You may use this until you are comfortable wiping as usual. To use the squirt bottle, follow these steps: °? Before you urinate, fill the squirt bottle with warm water. Do not use hot water. °? After you urinate, while you are sitting on the toilet, use the squirt bottle to rinse the area around your urethra and vaginal opening. This rinses away any urine and blood. °? You may do this instead of wiping. As you start healing, you may use the squirt bottle before wiping yourself. Make sure to wipe gently. °? Fill the squirt bottle with clean water every time you use the bathroom. °· You will be given sanitary pads to wear. °How can I expect to feel? °· You may not feel the need to urinate for several hours after delivery. °· You will have some soreness and pain in your abdomen and vagina. °· If you are breastfeeding, you may have uterine contractions every time you breastfeed for up to several weeks postpartum. Uterine contractions help your uterus return to its normal size. °· It is normal to have vaginal bleeding (lochia) after delivery. The amount and appearance of lochia is often similar to a menstrual period in the first week after delivery. It will gradually decrease over the next few weeks to a dry, yellow-brown discharge. For most women, lochia stops completely by 6-8 weeks after delivery. Vaginal bleeding can vary from woman to woman. °· Within the first few  days after delivery, you may have breast engorgement. This is when your breasts feel heavy, full, and uncomfortable. Your breasts may also throb and feel hard, tightly stretched, warm, and tender. After this occurs, you may have milk leaking from your breasts. Your health care provider can help you relieve discomfort due to breast engorgement. Breast engorgement should go away within a few days. °· You may feel more sad or worried than normal due to hormonal changes after delivery. These feelings should not last more than a few days. If these feelings do not go away after several days, speak with your health care provider. °How should I care for myself? °· Tell your health care provider if you have pain or discomfort. °· Drink enough water to keep your urine clear or pale yellow. °· Wash your hands thoroughly with soap and water for at least 20 seconds after changing your sanitary pads, after using the toilet, and before holding or feeding your baby. °· If you are not breastfeeding, avoid touching your breasts a lot. Doing this can make your breasts produce more milk. °· If you become weak or lightheaded, or you feel like you might faint, ask for help before: °? Getting out of bed. °? Showering. °· Change your sanitary pads frequently. Watch for any changes in your flow, such as a sudden increase in volume, a change in color, the passing of large blood clots. If you pass a blood clot from your vagina, save it   to show to your health care provider. Do not flush blood clots down the toilet without having your health care provider look at them. °· Make sure that all your vaccinations are up to date. This can help protect you and your baby from getting certain diseases. You may need to have immunizations done before you leave the hospital. °· If desired, talk with your health care provider about methods of family planning or birth control (contraception). °How can I start bonding with my baby? °Spending as much time as  possible with your baby is very important. During this time, you and your baby can get to know each other and develop a bond. Having your baby stay with you in your room (rooming in) can give you time to get to know your baby. Rooming in can also help you become comfortable caring for your baby. Breastfeeding can also help you bond with your baby. °How can I plan for returning home with my baby? °· Make sure that you have a car seat installed in your vehicle. °? Your car seat should be checked by a certified car seat installer to make sure that it is installed safely. °? Make sure that your baby fits into the car seat safely. °· Ask your health care provider any questions you have about caring for yourself or your baby. Make sure that you are able to contact your health care provider with any questions after leaving the hospital. °This information is not intended to replace advice given to you by your health care provider. Make sure you discuss any questions you have with your health care provider. °Document Released: 03/15/2007 Document Revised: 10/21/2015 Document Reviewed: 04/22/2015 °Elsevier Interactive Patient Education © 2018 [employer] ° °

## 2017-03-12 ENCOUNTER — Ambulatory Visit (INDEPENDENT_AMBULATORY_CARE_PROVIDER_SITE_OTHER): Payer: BLUE CROSS/BLUE SHIELD | Admitting: Obstetrics and Gynecology

## 2017-03-12 NOTE — Progress Notes (Signed)
Post Partum Exam  [name] [name] is a 31 y.o. [phone] female who presents for a postpartum visit. She is 6 weeks postpartum following a spontaneous vaginal delivery. I have fully reviewed the prenatal and intrapartum course. The delivery was at 39.5 gestational weeks.  Anesthesia: epidural. Postpartum course has been unremarkable. Baby's course has been unremarkable. Baby is feeding by breast. Bleeding Started cycle on 03/07/17. Bowel function is normal. Bladder function is normal. Patient is sexually active. Contraception method is condoms and oral progesterone-only contraceptive. Postpartum depression screening:Score=3     Review of Systems Pertinent items are noted in HPI.    Objective:  unknown if currently breastfeeding.  General:  alert, cooperative and no distress   Breasts:  inspection negative, no nipple discharge or bleeding, no masses or nodularity palpable  Lungs: clear to auscultation bilaterally  Heart:  regular rate and rhythm  Abdomen: soft, non-tender; bowel sounds normal; no masses,  no organomegaly   Vulva:  normal  Vagina: normal vagina, no discharge, exudate, lesion, or erythema  Cervix:  multiparous appearance  Corpus: normal size, contour, position, consistency, mobility, non-tender  Adnexa:  normal adnexa and no mass, fullness, tenderness  Rectal Exam: Not performed.        Assessment:    Normal postpartum exam. Pap smear not done at today's visit.   Plan:   1. Contraception: condoms and oral progesterone-only contraceptive. Partner is scheduled for vasectomy on 11/15 2. Patient is medically cleared to resume all activities of daily living 3. Follow up in: 6 months for annual exam or as needed.

## 2017-07-18 IMAGING — US US MFM OB DETAIL+14 WK
1 series · 13 of 28 positions shown · non-contrast
Comparison: none

[Series 1: us mfm ob detail+14 wk · 13 of 69 slices shown]
[im 3/69]
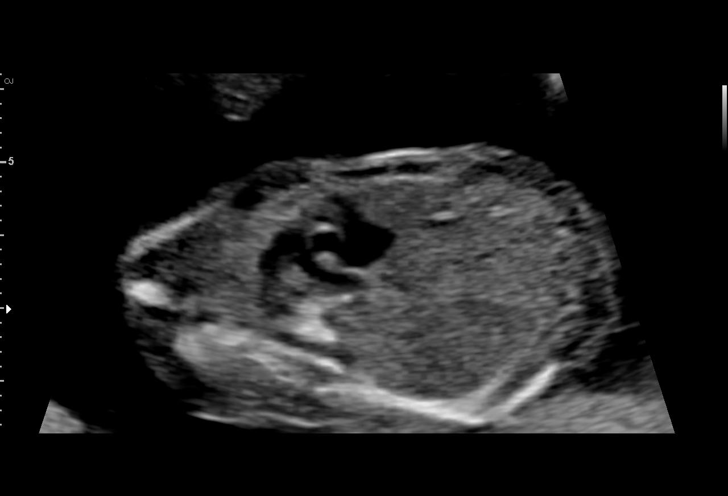
[im 8/69]
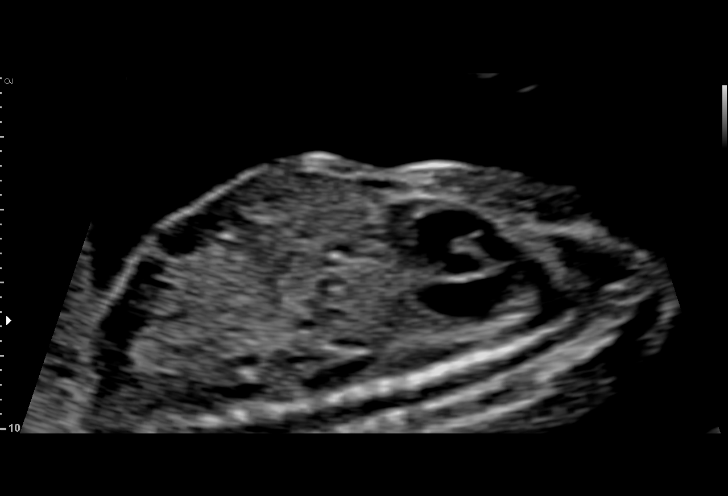
[im 13/69]
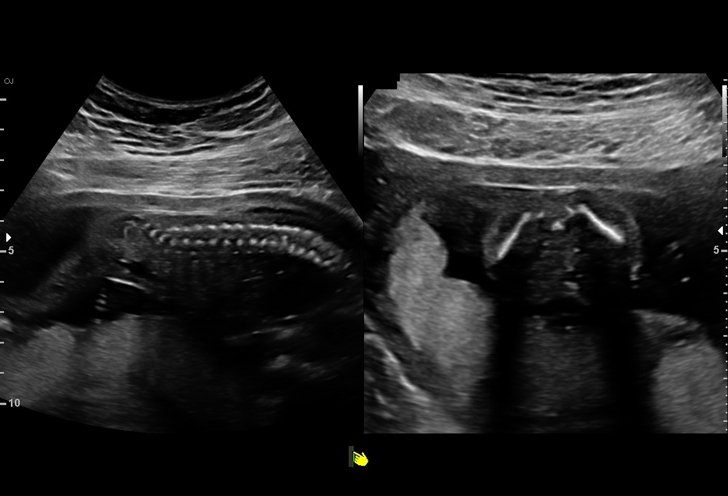
[im 18/69]
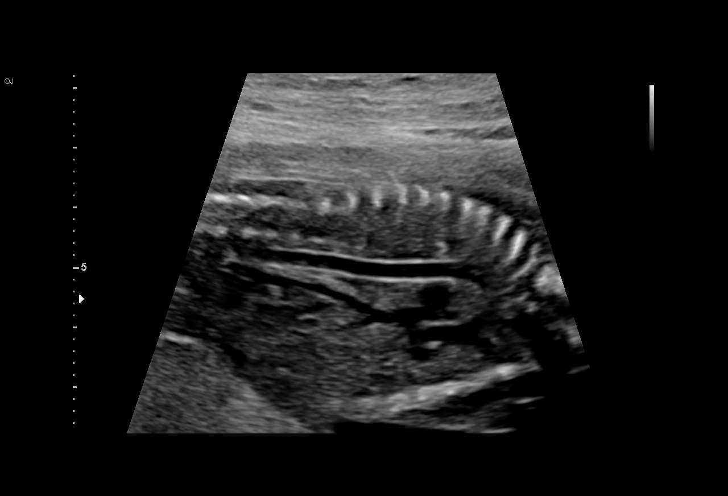
[im 23/69]
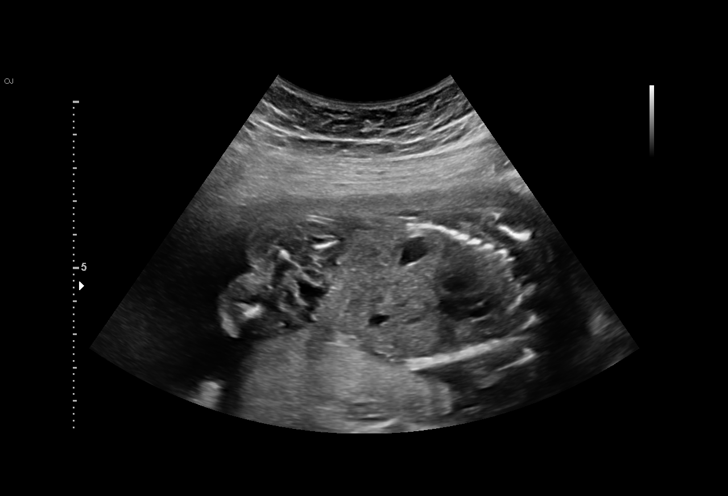
[im 28/69]
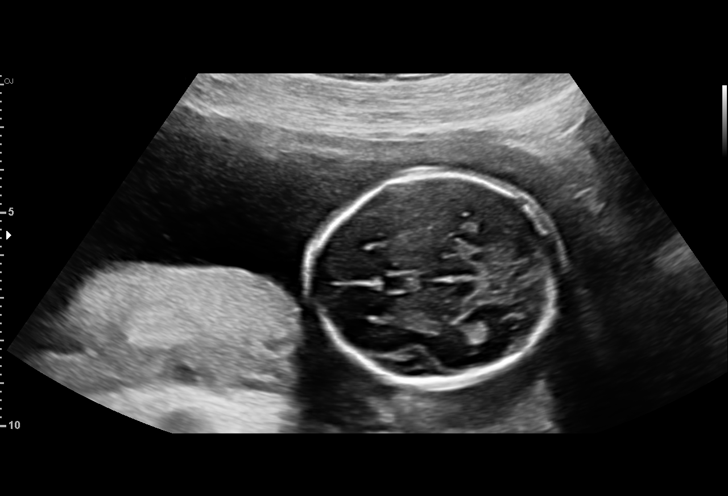
[im 36/69]
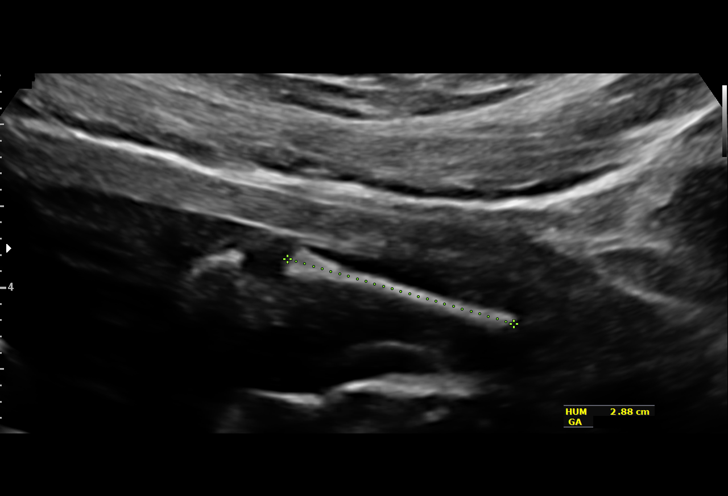
[im 41/69]
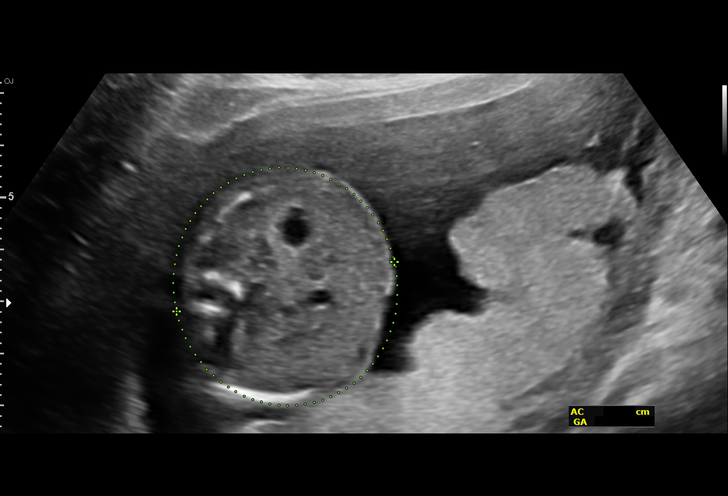
[im 46/69]
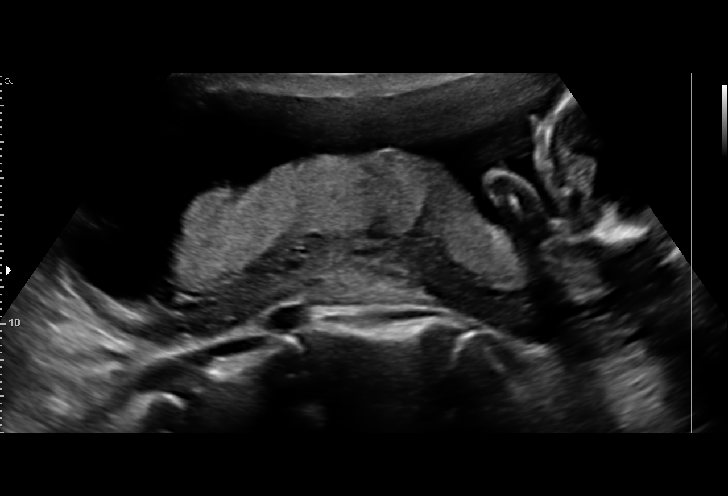
[im 51/69]
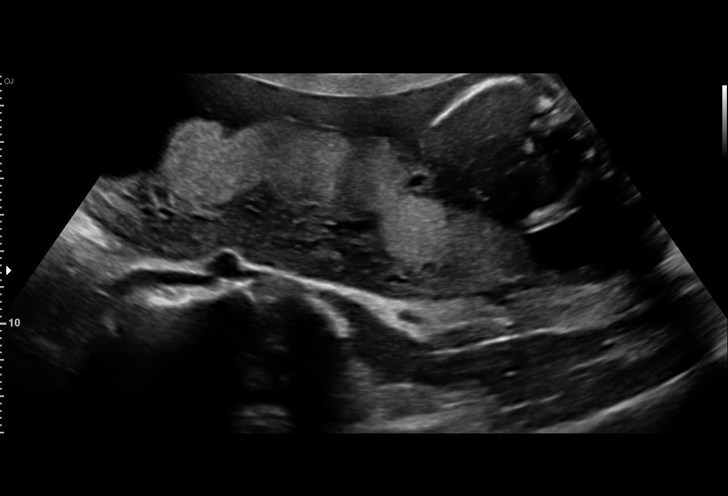
[im 56/69]
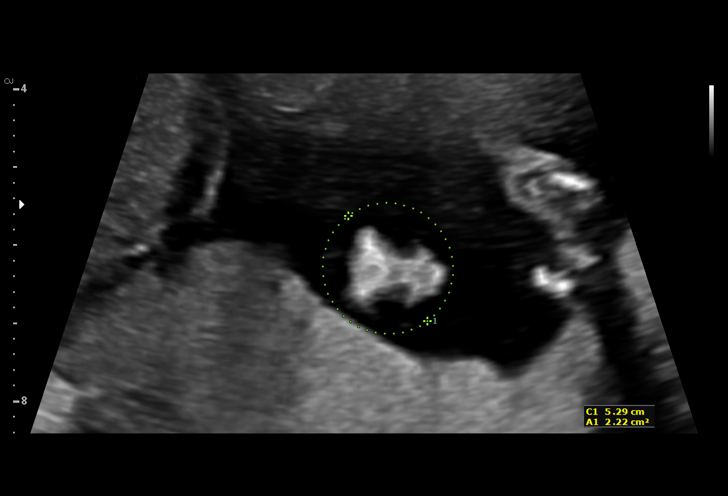
[im 61/69]
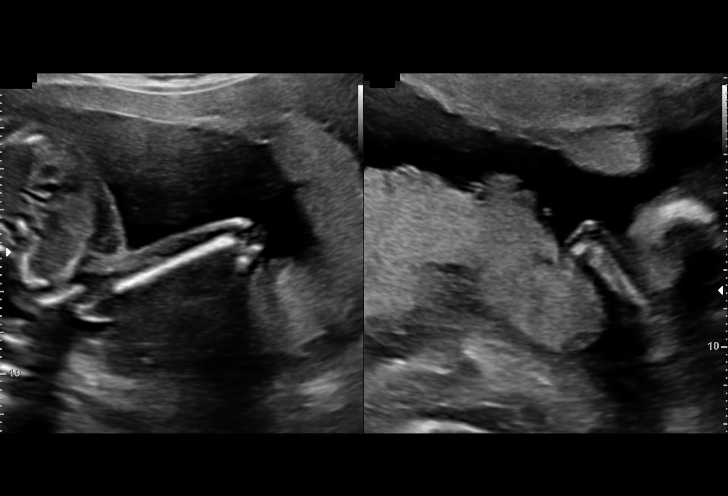
[im 66/69]
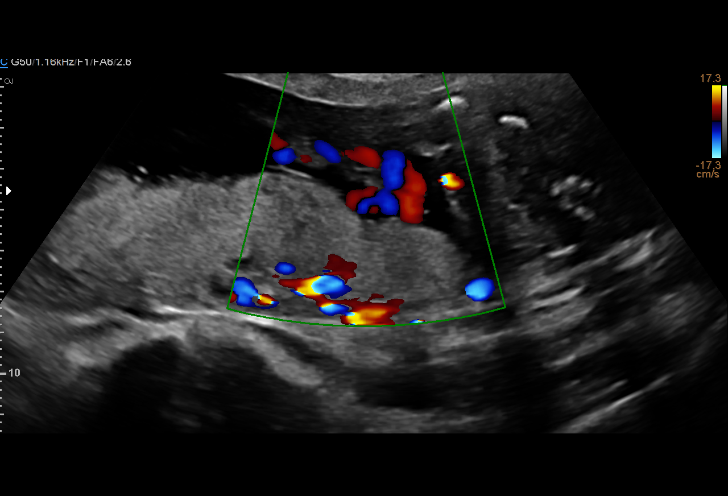

[13 of 28 positions shown; findings below may reference images not displayed]

1  TELEMACO TO           [id number]      [id number]     [id number]
Indications

19 weeks gestation of pregnancy
Encounter for antenatal screening for
malformations
Obesity complicating pregnancy, second
trimester
Echogenic intracardiac focus of the heart
(EIF)
OB History

Blood Type:            Height:  5'2"   Weight (lb):  206       BMI:
Gravidity:    3         Term:   1        Prem:   0        SAB:   1
TOP:          0       Ectopic:  0        Living: 1
Fetal Evaluation

Num Of Fetuses:     1
Fetal Heart         142
Rate(bpm):
Cardiac Activity:   Observed
Presentation:       Cephalic
Placenta:           Posterior, above cervical os
P. Cord Insertion:  Visualized

Amniotic Fluid
AFI FV:      Subjectively within normal limits

Largest Pocket(cm)
2.95
Biometry
BPD:      47.8  mm     G. Age:  20w 3d         95  %    CI:        77.09   %    70 - 86
FL/HC:      17.3   %    16.1 -
HC:      172.4  mm     G. Age:  19w 6d         79  %    HC/AC:      1.21        1.09 -
AC:      142.9  mm     G. Age:  19w 4d         67  %    FL/BPD:     62.3   %
FL:       29.8  mm     G. Age:  19w 2d         50  %    FL/AC:      20.9   %    20 - 24
HUM:      29.1  mm     G. Age:  19w 3d         62  %
CER:      20.3  mm     G. Age:  19w 2d         57  %
NFT:       3.9  mm

CM:        4.9  mm

Est. FW:     296  gm    0 lb 10 oz      53  %
Gestational Age

LMP:           20w 1d        Date:  04/16/16                 EDD:   01/21/17
U/S Today:     19w 6d                                        EDD:   01/23/17
Best:          19w 0d     Det. By:  Early Ultrasound         EDD:   01/29/17
(06/10/16)
Anatomy

Cranium:               Appears normal         Aortic Arch:            Appears normal
Cavum:                 Appears normal         Ductal Arch:            Appears normal
Ventricles:            Appears normal         Diaphragm:              Appears normal
Choroid Plexus:        Appears normal         Stomach:                Appears normal, left
sided
Cerebellum:            Appears normal         Abdomen:                Appears normal
Posterior Fossa:       Appears normal         Abdominal Wall:         Appears nml (cord
insert, abd wall)
Nuchal Fold:           Appears normal         Cord Vessels:           Appears normal (3
vessel cord)
Face:                  Appears normal         Kidneys:                Appear normal
(orbits and profile)
Lips:                  Appears normal         Bladder:                Appears normal
Thoracic:              Appears normal         Spine:                  Appears normal
Heart:                 Echogenic focus        Upper Extremities:      Appears normal
in LV
RVOT:                  Appears normal         Lower Extremities:      Appears normal
LVOT:                  Appears normal

Other:  Fetus appears to be a female. Technically difficult due to maternal
habitus and fetal position.
Cervix Uterus Adnexa

Cervix
Length:           3.92  cm.
Normal appearance by transabdominal scan.

Uterus
No abnormality visualized.

Left Ovary
Within normal limits.

Right Ovary
Within normal limits.
Comments

An echogenic focus was seen in the left cardiac ventricle.
However, no gross fetal anomalies or other soft markers of
aneuploidy were identified.   An echogenic intracardiac focus
is felt to represent a calcified papillary muscle, and is not
associated with structural or functional cardiac abnormalities.
Although an echogenic cardiac focus may be associated with
an increased risk of Down syndrome, this risk is felt to be
minimal, especially when it is seen as an isolated finding.
Impression

Single living intrauterine pregnancy at 19 weeks 0 days.
Appropriate fetal growth (53%).
Normal amniotic fluid volume.
The fetal anatomic survey is complete.
Normal fetal anatomy.
No fetal anomalies or soft markers of aneuploidy seen.

## 2017-08-09 ENCOUNTER — Encounter: Payer: Self-pay | Admitting: *Deleted

## 2017-08-09 ENCOUNTER — Telehealth: Payer: Self-pay | Admitting: *Deleted

## 2017-08-09 MED ORDER — DROSPIRENONE-ETHINYL ESTRADIOL 3-0.02 MG PO TABS: 1.0000 | ORAL_TABLET | Freq: Every day | ORAL | 11 refills | Status: DC

## 2017-08-09 NOTE — Telephone Encounter (Signed)
-----   Message from Pennie BanterMarni W Finerty sent at 08/06/2017  8:38 AM EST ----- Regarding: OCP rx request Tresa EndoKelly has finished breast feeding and would like to have her OCP changed.  She had used [facility] in the past.  [place] [facility] [place]

## 2017-11-23 ENCOUNTER — Encounter: Payer: Self-pay | Admitting: Obstetrics & Gynecology

## 2017-12-01 ENCOUNTER — Ambulatory Visit: Payer: BLUE CROSS/BLUE SHIELD | Admitting: Obstetrics & Gynecology

## 2017-12-06 ENCOUNTER — Ambulatory Visit (INDEPENDENT_AMBULATORY_CARE_PROVIDER_SITE_OTHER): Payer: BLUE CROSS/BLUE SHIELD | Admitting: Obstetrics & Gynecology

## 2017-12-06 ENCOUNTER — Encounter: Payer: Self-pay | Admitting: Obstetrics & Gynecology

## 2017-12-06 VITALS — BP 120/74 | HR 72 | Wt 186.5 lb

## 2017-12-06 DIAGNOSIS — Z01419 Encounter for gynecological examination (general) (routine) without abnormal findings: Secondary | ICD-10-CM

## 2017-12-06 DIAGNOSIS — Z1151 Encounter for screening for human papillomavirus (HPV): Secondary | ICD-10-CM

## 2017-12-06 DIAGNOSIS — Z124 Encounter for screening for malignant neoplasm of cervix: Secondary | ICD-10-CM | POA: Diagnosis not present
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
## 2017-12-06 NOTE — Patient Instructions (Signed)
Preventive Care 18-39 Years, Female Preventive care refers to lifestyle choices and visits with your health care provider that can promote health and wellness. What does preventive care include?  A yearly physical exam. This is also called an annual well check.  Dental exams once or twice a year.  Routine eye exams. Ask your health care provider how often you should have your eyes checked.  Personal lifestyle choices, including: ? Daily care of your teeth and gums. ? Regular physical activity. ? Eating a healthy diet. ? Avoiding tobacco and drug use. ? Limiting alcohol use. ? Practicing safe sex. ? Taking vitamin and mineral supplements as recommended by your health care provider. What happens during an annual well check? The services and screenings done by your health care provider during your annual well check will depend on your age, overall health, lifestyle risk factors, and family history of disease. Counseling Your health care provider may ask you questions about your:  Alcohol use.  Tobacco use.  Drug use.  Emotional well-being.  Home and relationship well-being.  Sexual activity.  Eating habits.  Work and work Statistician.  Method of birth control.  Menstrual cycle.  Pregnancy history.  Screening You may have the following tests or measurements:  Height, weight, and BMI.  Diabetes screening. This is done by checking your blood sugar (glucose) after you have not eaten for a while (fasting).  Blood pressure.  Lipid and cholesterol levels. These may be checked every 5 years starting at age 66.  Skin check.  Hepatitis C blood test.  Hepatitis B blood test.  Sexually transmitted disease (STD) testing.  BRCA-related cancer screening. This may be done if you have a family history of breast, ovarian, tubal, or peritoneal cancers.  Pelvic exam and Pap test. This may be done every 3 years starting at age 40. Starting at age 59, this may be done every 5  years if you have a Pap test in combination with an HPV test.  Discuss your test results, treatment options, and if necessary, the need for more tests with your health care provider. Vaccines Your health care provider may recommend certain vaccines, such as:  Influenza vaccine. This is recommended every year.  Tetanus, diphtheria, and acellular pertussis (Tdap, Td) vaccine. You may need a Td booster every 10 years.  Varicella vaccine. You may need this if you have not been vaccinated.  HPV vaccine. If you are 69 or younger, you may need three doses over 6 months.  Measles, mumps, and rubella (MMR) vaccine. You may need at least one dose of MMR. You may also need a second dose.  Pneumococcal 13-valent conjugate (PCV13) vaccine. You may need this if you have certain conditions and were not previously vaccinated.  Pneumococcal polysaccharide (PPSV23) vaccine. You may need one or two doses if you smoke cigarettes or if you have certain conditions.  Meningococcal vaccine. One dose is recommended if you are age 27-21 years and a first-year college student living in a residence hall, or if you have one of several medical conditions. You may also need additional booster doses.  Hepatitis A vaccine. You may need this if you have certain conditions or if you travel or work in places where you may be exposed to hepatitis A.  Hepatitis B vaccine. You may need this if you have certain conditions or if you travel or work in places where you may be exposed to hepatitis B.  Haemophilus influenzae type b (Hib) vaccine. You may need this if  you have certain risk factors.  Talk to your health care provider about which screenings and vaccines you need and how often you need them. This information is not intended to replace advice given to you by your health care provider. Make sure you discuss any questions you have with your health care provider. Document Released: 07/14/2001 Document Revised: 02/05/2016  Document Reviewed: 03/19/2015 Elsevier Interactive Patient Education  [employer].

## 2017-12-07 LAB — CYTOLOGY - PAP
DIAGNOSIS: NEGATIVE
HPV: NOT DETECTED

## 2018-04-19 DIAGNOSIS — M9904 Segmental and somatic dysfunction of sacral region: Secondary | ICD-10-CM | POA: Diagnosis not present

## 2018-04-19 DIAGNOSIS — M9903 Segmental and somatic dysfunction of lumbar region: Secondary | ICD-10-CM | POA: Diagnosis not present

## 2018-04-19 DIAGNOSIS — M5442 Lumbago with sciatica, left side: Secondary | ICD-10-CM | POA: Diagnosis not present

## 2018-04-19 DIAGNOSIS — M7918 Myalgia, other site: Secondary | ICD-10-CM | POA: Diagnosis not present

## 2018-04-20 DIAGNOSIS — M9903 Segmental and somatic dysfunction of lumbar region: Secondary | ICD-10-CM | POA: Diagnosis not present

## 2018-04-20 DIAGNOSIS — M9904 Segmental and somatic dysfunction of sacral region: Secondary | ICD-10-CM | POA: Diagnosis not present

## 2018-04-20 DIAGNOSIS — M5442 Lumbago with sciatica, left side: Secondary | ICD-10-CM | POA: Diagnosis not present

## 2018-04-20 DIAGNOSIS — M7918 Myalgia, other site: Secondary | ICD-10-CM | POA: Diagnosis not present

## 2018-04-21 DIAGNOSIS — M9904 Segmental and somatic dysfunction of sacral region: Secondary | ICD-10-CM | POA: Diagnosis not present

## 2018-04-21 DIAGNOSIS — M7918 Myalgia, other site: Secondary | ICD-10-CM | POA: Diagnosis not present

## 2018-04-21 DIAGNOSIS — M9903 Segmental and somatic dysfunction of lumbar region: Secondary | ICD-10-CM | POA: Diagnosis not present

## 2018-04-21 DIAGNOSIS — M5442 Lumbago with sciatica, left side: Secondary | ICD-10-CM | POA: Diagnosis not present

## 2018-04-25 DIAGNOSIS — M7918 Myalgia, other site: Secondary | ICD-10-CM | POA: Diagnosis not present

## 2018-04-25 DIAGNOSIS — M9904 Segmental and somatic dysfunction of sacral region: Secondary | ICD-10-CM | POA: Diagnosis not present

## 2018-04-25 DIAGNOSIS — M5442 Lumbago with sciatica, left side: Secondary | ICD-10-CM | POA: Diagnosis not present

## 2018-04-25 DIAGNOSIS — M9903 Segmental and somatic dysfunction of lumbar region: Secondary | ICD-10-CM | POA: Diagnosis not present

## 2018-04-26 DIAGNOSIS — M9904 Segmental and somatic dysfunction of sacral region: Secondary | ICD-10-CM | POA: Diagnosis not present

## 2018-04-26 DIAGNOSIS — M5442 Lumbago with sciatica, left side: Secondary | ICD-10-CM | POA: Diagnosis not present

## 2018-04-26 DIAGNOSIS — M7918 Myalgia, other site: Secondary | ICD-10-CM | POA: Diagnosis not present

## 2018-04-26 DIAGNOSIS — M9903 Segmental and somatic dysfunction of lumbar region: Secondary | ICD-10-CM | POA: Diagnosis not present

## 2018-05-03 DIAGNOSIS — M7918 Myalgia, other site: Secondary | ICD-10-CM | POA: Diagnosis not present

## 2018-05-03 DIAGNOSIS — M5442 Lumbago with sciatica, left side: Secondary | ICD-10-CM | POA: Diagnosis not present

## 2018-05-03 DIAGNOSIS — M9904 Segmental and somatic dysfunction of sacral region: Secondary | ICD-10-CM | POA: Diagnosis not present

## 2018-05-03 DIAGNOSIS — M9903 Segmental and somatic dysfunction of lumbar region: Secondary | ICD-10-CM | POA: Diagnosis not present

## 2018-05-05 DIAGNOSIS — M9904 Segmental and somatic dysfunction of sacral region: Secondary | ICD-10-CM | POA: Diagnosis not present

## 2018-05-05 DIAGNOSIS — M9903 Segmental and somatic dysfunction of lumbar region: Secondary | ICD-10-CM | POA: Diagnosis not present

## 2018-05-05 DIAGNOSIS — M5442 Lumbago with sciatica, left side: Secondary | ICD-10-CM | POA: Diagnosis not present

## 2018-05-05 DIAGNOSIS — M7918 Myalgia, other site: Secondary | ICD-10-CM | POA: Diagnosis not present

## 2018-05-10 DIAGNOSIS — M7918 Myalgia, other site: Secondary | ICD-10-CM | POA: Diagnosis not present

## 2018-05-10 DIAGNOSIS — M5442 Lumbago with sciatica, left side: Secondary | ICD-10-CM | POA: Diagnosis not present

## 2018-05-10 DIAGNOSIS — M9903 Segmental and somatic dysfunction of lumbar region: Secondary | ICD-10-CM | POA: Diagnosis not present

## 2018-05-10 DIAGNOSIS — M9904 Segmental and somatic dysfunction of sacral region: Secondary | ICD-10-CM | POA: Diagnosis not present

## 2018-05-16 DIAGNOSIS — M9904 Segmental and somatic dysfunction of sacral region: Secondary | ICD-10-CM | POA: Diagnosis not present

## 2018-05-16 DIAGNOSIS — M7918 Myalgia, other site: Secondary | ICD-10-CM | POA: Diagnosis not present

## 2018-05-16 DIAGNOSIS — M5442 Lumbago with sciatica, left side: Secondary | ICD-10-CM | POA: Diagnosis not present

## 2018-05-16 DIAGNOSIS — M9903 Segmental and somatic dysfunction of lumbar region: Secondary | ICD-10-CM | POA: Diagnosis not present

## 2018-05-23 DIAGNOSIS — M5442 Lumbago with sciatica, left side: Secondary | ICD-10-CM | POA: Diagnosis not present

## 2018-05-23 DIAGNOSIS — M9903 Segmental and somatic dysfunction of lumbar region: Secondary | ICD-10-CM | POA: Diagnosis not present

## 2018-05-23 DIAGNOSIS — M7918 Myalgia, other site: Secondary | ICD-10-CM | POA: Diagnosis not present

## 2018-05-23 DIAGNOSIS — M9904 Segmental and somatic dysfunction of sacral region: Secondary | ICD-10-CM | POA: Diagnosis not present

## 2018-06-06 DIAGNOSIS — M9904 Segmental and somatic dysfunction of sacral region: Secondary | ICD-10-CM | POA: Diagnosis not present

## 2018-06-06 DIAGNOSIS — M5442 Lumbago with sciatica, left side: Secondary | ICD-10-CM | POA: Diagnosis not present

## 2018-06-06 DIAGNOSIS — M9903 Segmental and somatic dysfunction of lumbar region: Secondary | ICD-10-CM | POA: Diagnosis not present

## 2018-06-06 DIAGNOSIS — M7918 Myalgia, other site: Secondary | ICD-10-CM | POA: Diagnosis not present

## 2018-06-07 ENCOUNTER — Other Ambulatory Visit: Payer: Self-pay | Admitting: Obstetrics & Gynecology

## 2018-06-07 MED ORDER — RIZATRIPTAN BENZOATE 5 MG PO TABS: 5.0000 mg | ORAL_TABLET | ORAL | 3 refills | Status: DC | PRN

## 2018-06-07 NOTE — Progress Notes (Signed)
maxalt prescribed per patient request. I also rec'd that she schedule an appt with Nada Maclachlan.

## 2018-06-08 DIAGNOSIS — M9904 Segmental and somatic dysfunction of sacral region: Secondary | ICD-10-CM | POA: Diagnosis not present

## 2018-06-08 DIAGNOSIS — M5442 Lumbago with sciatica, left side: Secondary | ICD-10-CM | POA: Diagnosis not present

## 2018-06-08 DIAGNOSIS — M7918 Myalgia, other site: Secondary | ICD-10-CM | POA: Diagnosis not present

## 2018-06-08 DIAGNOSIS — M9903 Segmental and somatic dysfunction of lumbar region: Secondary | ICD-10-CM | POA: Diagnosis not present

## 2018-06-09 DIAGNOSIS — M9903 Segmental and somatic dysfunction of lumbar region: Secondary | ICD-10-CM | POA: Diagnosis not present

## 2018-06-09 DIAGNOSIS — M5442 Lumbago with sciatica, left side: Secondary | ICD-10-CM | POA: Diagnosis not present

## 2018-06-09 DIAGNOSIS — M9904 Segmental and somatic dysfunction of sacral region: Secondary | ICD-10-CM | POA: Diagnosis not present

## 2018-06-09 DIAGNOSIS — M7918 Myalgia, other site: Secondary | ICD-10-CM | POA: Diagnosis not present

## 2018-06-28 ENCOUNTER — Other Ambulatory Visit: Payer: Self-pay | Admitting: *Deleted

## 2018-06-28 MED ORDER — DROSPIRENONE-ETHINYL ESTRADIOL 3-0.02 MG PO TABS: 1.0000 | ORAL_TABLET | Freq: Every day | ORAL | 11 refills | Status: DC

## 2018-07-01 ENCOUNTER — Encounter: Payer: Self-pay | Admitting: Physician Assistant

## 2018-07-01 ENCOUNTER — Ambulatory Visit: Payer: BLUE CROSS/BLUE SHIELD | Admitting: Physician Assistant

## 2018-07-01 VITALS — BP 125/84 | HR 84 | Ht 62.0 in | Wt 185.0 lb

## 2018-07-01 DIAGNOSIS — G4489 Other headache syndrome: Secondary | ICD-10-CM

## 2018-07-01 DIAGNOSIS — G43009 Migraine without aura, not intractable, without status migrainosus: Secondary | ICD-10-CM | POA: Diagnosis not present

## 2018-07-01 HISTORY — DX: Other headache syndrome: G44.89

## 2018-07-01 MED ORDER — SUMATRIPTAN SUCCINATE 100 MG PO TABS: 100.0000 mg | ORAL_TABLET | Freq: Once | ORAL | 3 refills | Status: DC | PRN

## 2018-07-01 MED ORDER — TOPIRAMATE 25 MG PO TABS: 75.0000 mg | ORAL_TABLET | Freq: Every day | ORAL | 3 refills | Status: DC
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
## 2018-07-01 NOTE — Patient Instructions (Signed)

## 2018-09-12 DIAGNOSIS — L0390 Cellulitis, unspecified: Secondary | ICD-10-CM | POA: Diagnosis not present

## 2018-09-16 ENCOUNTER — Encounter: Payer: Self-pay | Admitting: Physician Assistant

## 2018-09-16 ENCOUNTER — Ambulatory Visit (INDEPENDENT_AMBULATORY_CARE_PROVIDER_SITE_OTHER): Payer: BLUE CROSS/BLUE SHIELD | Admitting: Physician Assistant

## 2018-09-16 ENCOUNTER — Other Ambulatory Visit: Payer: Self-pay

## 2018-09-16 DIAGNOSIS — G43009 Migraine without aura, not intractable, without status migrainosus: Secondary | ICD-10-CM | POA: Diagnosis not present

## 2018-09-16 DIAGNOSIS — G4489 Other headache syndrome: Secondary | ICD-10-CM

## 2018-09-16 MED ORDER — SUMATRIPTAN SUCCINATE 100 MG PO TABS: 100.0000 mg | ORAL_TABLET | Freq: Once | ORAL | 11 refills | Status: DC | PRN

## 2018-09-16 MED ORDER — METOPROLOL TARTRATE 25 MG PO TABS: 25.0000 mg | ORAL_TABLET | Freq: Two times a day (BID) | ORAL | 2 refills | Status: DC

## 2018-09-16 NOTE — Patient Instructions (Signed)
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
## 2018-10-01 DIAGNOSIS — N390 Urinary tract infection, site not specified: Secondary | ICD-10-CM | POA: Diagnosis not present

## 2018-10-15 DIAGNOSIS — L0100 Impetigo, unspecified: Secondary | ICD-10-CM | POA: Diagnosis not present

## 2018-12-08 ENCOUNTER — Other Ambulatory Visit: Payer: Self-pay

## 2018-12-09 ENCOUNTER — Telehealth: Payer: BLUE CROSS/BLUE SHIELD | Admitting: Physician Assistant

## 2018-12-09 ENCOUNTER — Encounter: Payer: Self-pay | Admitting: Physician Assistant

## 2018-12-09 ENCOUNTER — Other Ambulatory Visit: Payer: Self-pay

## 2018-12-09 ENCOUNTER — Telehealth (INDEPENDENT_AMBULATORY_CARE_PROVIDER_SITE_OTHER): Payer: BC Managed Care – PPO | Admitting: Physician Assistant

## 2018-12-09 DIAGNOSIS — G43009 Migraine without aura, not intractable, without status migrainosus: Secondary | ICD-10-CM | POA: Diagnosis not present

## 2018-12-09 DIAGNOSIS — G4489 Other headache syndrome: Secondary | ICD-10-CM

## 2018-12-09 MED ORDER — METOPROLOL TARTRATE 25 MG PO TABS: 25.0000 mg | ORAL_TABLET | Freq: Two times a day (BID) | ORAL | 3 refills | Status: DC

## 2018-12-09 MED ORDER — SUMATRIPTAN SUCCINATE 100 MG PO TABS: 100.0000 mg | ORAL_TABLET | Freq: Once | ORAL | 11 refills | Status: DC | PRN

## 2018-12-09 NOTE — Patient Instructions (Signed)
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
## 2018-12-14 ENCOUNTER — Encounter: Payer: Self-pay | Admitting: Radiology

## 2019-01-03 ENCOUNTER — Encounter: Payer: Self-pay | Admitting: Obstetrics & Gynecology

## 2019-01-03 ENCOUNTER — Other Ambulatory Visit: Payer: Self-pay

## 2019-01-03 ENCOUNTER — Ambulatory Visit (INDEPENDENT_AMBULATORY_CARE_PROVIDER_SITE_OTHER): Payer: BC Managed Care – PPO | Admitting: Obstetrics & Gynecology

## 2019-01-03 VITALS — BP 127/92 | HR 79 | Ht 61.0 in | Wt 190.0 lb

## 2019-01-03 DIAGNOSIS — Z01419 Encounter for gynecological examination (general) (routine) without abnormal findings: Secondary | ICD-10-CM

## 2019-01-03 DIAGNOSIS — Z3041 Encounter for surveillance of contraceptive pills: Secondary | ICD-10-CM | POA: Diagnosis not present

## 2019-01-03 DIAGNOSIS — R7989 Other specified abnormal findings of blood chemistry: Secondary | ICD-10-CM | POA: Diagnosis not present

## 2019-01-03 DIAGNOSIS — Z0000 Encounter for general adult medical examination without abnormal findings: Secondary | ICD-10-CM | POA: Diagnosis not present

## 2019-01-03 MED ORDER — DROSPIRENONE-ETHINYL ESTRADIOL 3-0.02 MG PO TABS: 1.0000 | ORAL_TABLET | Freq: Every day | ORAL | 5 refills | Status: DC
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
## 2019-01-03 NOTE — Patient Instructions (Signed)
Preventive Care 21-31 Years Old, Female Preventive care refers to visits with your health care provider and lifestyle choices that can promote health and wellness. This includes:  A yearly physical exam. This may also be called an annual well check.  Regular dental visits and eye exams.  Immunizations.  Screening for certain conditions.  Healthy lifestyle choices, such as eating a healthy diet, getting regular exercise, not using drugs or products that contain nicotine and tobacco, and limiting alcohol use. What can I expect for my preventive care visit? Physical exam Your health care provider will check your:  Height and weight. This may be used to calculate body mass index (BMI), which tells if you are at a healthy weight.  Heart rate and blood pressure.  Skin for abnormal spots. Counseling Your health care provider may ask you questions about your:  Alcohol, tobacco, and drug use.  Emotional well-being.  Home and relationship well-being.  Sexual activity.  Eating habits.  Work and work environment.  Method of birth control.  Menstrual cycle.  Pregnancy history. What immunizations do I need?  Influenza (flu) vaccine  This is recommended every year. Tetanus, diphtheria, and pertussis (Tdap) vaccine  You may need a Td booster every 10 years. Varicella (chickenpox) vaccine  You may need this if you have not been vaccinated. Human papillomavirus (HPV) vaccine  If recommended by your health care provider, you may need three doses over 6 months. Measles, mumps, and rubella (MMR) vaccine  You may need at least one dose of MMR. You may also need a second dose. Meningococcal conjugate (MenACWY) vaccine  One dose is recommended if you are age 19-21 years and a first-year college student living in a residence hall, or if you have one of several medical conditions. You may also need additional booster doses. Pneumococcal conjugate (PCV13) vaccine  You may need  this if you have certain conditions and were not previously vaccinated. Pneumococcal polysaccharide (PPSV23) vaccine  You may need one or two doses if you smoke cigarettes or if you have certain conditions. Hepatitis A vaccine  You may need this if you have certain conditions or if you travel or work in places where you may be exposed to hepatitis A. Hepatitis B vaccine  You may need this if you have certain conditions or if you travel or work in places where you may be exposed to hepatitis B. Haemophilus influenzae type b (Hib) vaccine  You may need this if you have certain conditions. You may receive vaccines as individual doses or as more than one vaccine together in one shot (combination vaccines). Talk with your health care provider about the risks and benefits of combination vaccines. What tests do I need?  Blood tests  Lipid and cholesterol levels. These may be checked every 5 years starting at age 20.  Hepatitis C test.  Hepatitis B test. Screening  Diabetes screening. This is done by checking your blood sugar (glucose) after you have not eaten for a while (fasting).  Sexually transmitted disease (STD) testing.  BRCA-related cancer screening. This may be done if you have a family history of breast, ovarian, tubal, or peritoneal cancers.  Pelvic exam and Pap test. This may be done every 3 years starting at age 21. Starting at age 30, this may be done every 5 years if you have a Pap test in combination with an HPV test. Talk with your health care provider about your test results, treatment options, and if necessary, the need for more tests.   Follow these instructions at home: Eating and drinking   Eat a diet that includes fresh fruits and vegetables, whole grains, lean protein, and low-fat dairy.  Take vitamin and mineral supplements as recommended by your health care provider.  Do not drink alcohol if: ? Your health care provider tells you not to drink. ? You are  pregnant, may be pregnant, or are planning to become pregnant.  If you drink alcohol: ? Limit how much you have to 0-1 drink a day. ? Be aware of how much alcohol is in your drink. In the [place] one drink equals one 12 oz bottle of beer (355 mL), one 5 oz glass of wine (148 mL), or one 1 oz glass of hard liquor (44 mL). Lifestyle  Take daily care of your teeth and gums.  Stay active. Exercise for at least 30 minutes on 5 or more days each week.  Do not use any products that contain nicotine or tobacco, such as cigarettes, e-cigarettes, and chewing tobacco. If you need help quitting, ask your health care provider.  If you are sexually active, practice safe sex. Use a condom or other form of birth control (contraception) in order to prevent pregnancy and STIs (sexually transmitted infections). If you plan to become pregnant, see your health care provider for a preconception visit. What's next?  Visit your health care provider once a year for a well check visit.  Ask your health care provider how often you should have your eyes and teeth checked.  Stay up to date on all vaccines. This information is not intended to replace advice given to you by your health care provider. Make sure you discuss any questions you have with your health care provider. Document Released: 07/14/2001 Document Revised: 01/27/2018 Document Reviewed: 01/27/2018 Elsevier Patient Education  2020 [employer]vier Inc[employer]  

## 2019-01-04 ENCOUNTER — Other Ambulatory Visit: Payer: Self-pay | Admitting: Obstetrics & Gynecology

## 2019-01-04 DIAGNOSIS — R7989 Other specified abnormal findings of blood chemistry: Secondary | ICD-10-CM

## 2019-01-04 LAB — CBC
Hematocrit: 40.2 % (ref 34.0–46.6)
Hemoglobin: 13.5 g/dL (ref 11.1–15.9)
MCH: 28.7 pg (ref 26.6–33.0)
MCHC: 33.6 g/dL (ref 31.5–35.7)
MCV: 85 fL (ref 79–97)
Platelets: 272 10*3/uL (ref 150–450)
RBC: 4.71 x10E6/uL (ref 3.77–5.28)
RDW: 11.9 % (ref 11.7–15.4)
WBC: 6.1 10*3/uL (ref 3.4–10.8)

## 2019-01-04 LAB — COMPREHENSIVE METABOLIC PANEL
ALT: 11 IU/L (ref 0–32)
AST: 11 IU/L (ref 0–40)
Albumin/Globulin Ratio: 1.9 (ref 1.2–2.2)
Albumin: 4.3 g/dL (ref 3.8–4.8)
Alkaline Phosphatase: 35 IU/L — ABNORMAL LOW (ref 39–117)
BUN/Creatinine Ratio: 13 (ref 9–23)
BUN: 11 mg/dL (ref 6–20)
Bilirubin Total: 0.3 mg/dL (ref 0.0–1.2)
CO2: 20 mmol/L (ref 20–29)
Calcium: 9.9 mg/dL (ref 8.7–10.2)
Chloride: 100 mmol/L (ref 96–106)
Creatinine, Ser: 0.88 mg/dL (ref 0.57–1.00)
GFR calc Af Amer: 101 mL/min/{1.73_m2} (ref >59)
GFR calc non Af Amer: 88 mL/min/{1.73_m2} (ref >59)
Globulin, Total: 2.3 g/dL (ref 1.5–4.5)
Glucose: 84 mg/dL (ref 65–99)
Potassium: 4.2 mmol/L (ref 3.5–5.2)
Sodium: 137 mmol/L (ref 134–144)
Total Protein: 6.6 g/dL (ref 6.0–8.5)

## 2019-01-04 LAB — LIPID PANEL
Chol/HDL Ratio: 3.4 ratio (ref 0.0–4.4)
Cholesterol, Total: 219 mg/dL — ABNORMAL HIGH (ref 100–199)
HDL: 65 mg/dL (ref >39)
LDL Calculated: 96 mg/dL (ref 0–99)
Triglycerides: 291 mg/dL — ABNORMAL HIGH (ref 0–149)
VLDL Cholesterol Cal: 58 mg/dL — ABNORMAL HIGH (ref 5–40)

## 2019-01-04 LAB — VITAMIN D 25 HYDROXY (VIT D DEFICIENCY, FRACTURES): Vit D, 25-Hydroxy: 34.2 ng/mL (ref 30.0–100.0)

## 2019-01-04 LAB — TSH: TSH: 8.23 u[IU]/mL — ABNORMAL HIGH (ref 0.450–4.500)

## 2019-01-04 LAB — HEMOGLOBIN A1C
Est. average glucose Bld gHb Est-mCnc: 88 mg/dL
Hgb A1c MFr Bld: 4.7 % — ABNORMAL LOW (ref 4.8–5.6)

## 2019-01-05 LAB — T3 UPTAKE: T3 Uptake Ratio: 19 % — ABNORMAL LOW (ref 24–39)

## 2019-01-05 LAB — T4, FREE: Free T4: 1.22 ng/dL (ref 0.82–1.77)

## 2019-01-05 LAB — SPECIMEN STATUS REPORT

## 2019-01-05 LAB — T3, FREE: T3, Free: 3.8 pg/mL (ref 2.0–4.4)

## 2019-05-30 ENCOUNTER — Other Ambulatory Visit: Payer: Self-pay

## 2019-05-30 MED ORDER — METOPROLOL TARTRATE 25 MG PO TABS: 25.0000 mg | ORAL_TABLET | Freq: Two times a day (BID) | ORAL | 3 refills | Status: DC

## 2019-09-11 ENCOUNTER — Ambulatory Visit (INDEPENDENT_AMBULATORY_CARE_PROVIDER_SITE_OTHER)
Admission: RE | Admit: 2019-09-11 | Discharge: 2019-09-11 | Disposition: A | Discharge status: Home / Self Care | Payer: BC Managed Care – PPO | Source: Ambulatory Visit

## 2019-09-11 DIAGNOSIS — R05 Cough: Secondary | ICD-10-CM | POA: Diagnosis not present

## 2019-09-11 DIAGNOSIS — R059 Cough, unspecified: Secondary | ICD-10-CM

## 2019-09-11 MED ORDER — BENZONATATE 100 MG PO CAPS: 100.0000 mg | ORAL_CAPSULE | Freq: Three times a day (TID) | ORAL | 0 refills | Status: DC

## 2019-09-11 NOTE — Discharge Instructions (Signed)
Take the Tessalon Perles as needed for cough.    Follow up with your primary care provider or come here to be seen in person if your symptoms are not improving.      
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
## 2019-09-13 DIAGNOSIS — R062 Wheezing: Secondary | ICD-10-CM | POA: Diagnosis not present

## 2019-09-13 DIAGNOSIS — R05 Cough: Secondary | ICD-10-CM | POA: Diagnosis not present

## 2019-10-03 DIAGNOSIS — R062 Wheezing: Secondary | ICD-10-CM | POA: Diagnosis not present

## 2019-10-03 DIAGNOSIS — J019 Acute sinusitis, unspecified: Secondary | ICD-10-CM | POA: Diagnosis not present

## 2019-10-09 ENCOUNTER — Other Ambulatory Visit: Payer: Self-pay | Admitting: Physician Assistant

## 2019-11-15 ENCOUNTER — Encounter: Payer: Self-pay | Admitting: Radiology

## 2019-11-28 DIAGNOSIS — N390 Urinary tract infection, site not specified: Secondary | ICD-10-CM | POA: Diagnosis not present

## 2019-12-16 ENCOUNTER — Other Ambulatory Visit: Payer: Self-pay | Admitting: Physician Assistant

## 2020-01-09 ENCOUNTER — Ambulatory Visit (INDEPENDENT_AMBULATORY_CARE_PROVIDER_SITE_OTHER): Payer: BC Managed Care – PPO | Admitting: Obstetrics & Gynecology

## 2020-01-09 ENCOUNTER — Other Ambulatory Visit: Payer: Self-pay

## 2020-01-09 ENCOUNTER — Encounter: Payer: Self-pay | Admitting: Obstetrics & Gynecology

## 2020-01-09 VITALS — BP 125/62 | HR 72 | Wt 186.2 lb

## 2020-01-09 DIAGNOSIS — Z01419 Encounter for gynecological examination (general) (routine) without abnormal findings: Secondary | ICD-10-CM
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
## 2020-01-09 NOTE — Patient Instructions (Signed)
Preventive Care 21-32 Years Old, Female Preventive care refers to visits with your health care provider and lifestyle choices that can promote health and wellness. This includes:  A yearly physical exam. This may also be called an annual well check.  Regular dental visits and eye exams.  Immunizations.  Screening for certain conditions.  Healthy lifestyle choices, such as eating a healthy diet, getting regular exercise, not using drugs or products that contain nicotine and tobacco, and limiting alcohol use. What can I expect for my preventive care visit? Physical exam Your health care provider will check your:  Height and weight. This may be used to calculate body mass index (BMI), which tells if you are at a healthy weight.  Heart rate and blood pressure.  Skin for abnormal spots. Counseling Your health care provider may ask you questions about your:  Alcohol, tobacco, and drug use.  Emotional well-being.  Home and relationship well-being.  Sexual activity.  Eating habits.  Work and work environment.  Method of birth control.  Menstrual cycle.  Pregnancy history. What immunizations do I need?  Influenza (flu) vaccine  This is recommended every year. Tetanus, diphtheria, and pertussis (Tdap) vaccine  You may need a Td booster every 10 years. Varicella (chickenpox) vaccine  You may need this if you have not been vaccinated. Human papillomavirus (HPV) vaccine  If recommended by your health care provider, you may need three doses over 6 months. Measles, mumps, and rubella (MMR) vaccine  You may need at least one dose of MMR. You may also need a second dose. Meningococcal conjugate (MenACWY) vaccine  One dose is recommended if you are age 19-21 years and a first-year college student living in a residence hall, or if you have one of several medical conditions. You may also need additional booster doses. Pneumococcal conjugate (PCV13) vaccine  You may need  this if you have certain conditions and were not previously vaccinated. Pneumococcal polysaccharide (PPSV23) vaccine  You may need one or two doses if you smoke cigarettes or if you have certain conditions. Hepatitis A vaccine  You may need this if you have certain conditions or if you travel or work in places where you may be exposed to hepatitis A. Hepatitis B vaccine  You may need this if you have certain conditions or if you travel or work in places where you may be exposed to hepatitis B. Haemophilus influenzae type b (Hib) vaccine  You may need this if you have certain conditions. You may receive vaccines as individual doses or as more than one vaccine together in one shot (combination vaccines). Talk with your health care provider about the risks and benefits of combination vaccines. What tests do I need?  Blood tests  Lipid and cholesterol levels. These may be checked every 5 years starting at age 20.  Hepatitis C test.  Hepatitis B test. Screening  Diabetes screening. This is done by checking your blood sugar (glucose) after you have not eaten for a while (fasting).  Sexually transmitted disease (STD) testing.  BRCA-related cancer screening. This may be done if you have a family history of breast, ovarian, tubal, or peritoneal cancers.  Pelvic exam and Pap test. This may be done every 3 years starting at age 21. Starting at age 30, this may be done every 5 years if you have a Pap test in combination with an HPV test. Talk with your health care provider about your test results, treatment options, and if necessary, the need for more tests.   Follow these instructions at home: Eating and drinking   Eat a diet that includes fresh fruits and vegetables, whole grains, lean protein, and low-fat dairy.  Take vitamin and mineral supplements as recommended by your health care provider.  Do not drink alcohol if: ? Your health care provider tells you not to drink. ? You are  pregnant, may be pregnant, or are planning to become pregnant.  If you drink alcohol: ? Limit how much you have to 0-1 drink a day. ? Be aware of how much alcohol is in your drink. In the [place] one drink equals one 12 oz bottle of beer (355 mL), one 5 oz glass of wine (148 mL), or one 1 oz glass of hard liquor (44 mL). Lifestyle  Take daily care of your teeth and gums.  Stay active. Exercise for at least 30 minutes on 5 or more days each week.  Do not use any products that contain nicotine or tobacco, such as cigarettes, e-cigarettes, and chewing tobacco. If you need help quitting, ask your health care provider.  If you are sexually active, practice safe sex. Use a condom or other form of birth control (contraception) in order to prevent pregnancy and STIs (sexually transmitted infections). If you plan to become pregnant, see your health care provider for a preconception visit. What's next?  Visit your health care provider once a year for a well check visit.  Ask your health care provider how often you should have your eyes and teeth checked.  Stay up to date on all vaccines. This information is not intended to replace advice given to you by your health care provider. Make sure you discuss any questions you have with your health care provider. Document Revised: 01/27/2018 Document Reviewed: 01/27/2018 Elsevier Patient Education  2020 [employer].

## 2020-01-16 ENCOUNTER — Other Ambulatory Visit: Payer: Self-pay

## 2020-01-18 ENCOUNTER — Other Ambulatory Visit: Payer: Self-pay

## 2020-01-18 ENCOUNTER — Ambulatory Visit: Payer: BC Managed Care – PPO | Admitting: Nurse Practitioner

## 2020-01-18 ENCOUNTER — Encounter: Payer: Self-pay | Admitting: Nurse Practitioner

## 2020-01-18 VITALS — BP 118/84 | HR 86 | Temp 98.2°F | Ht 61.0 in | Wt 188.0 lb

## 2020-01-18 DIAGNOSIS — R7989 Other specified abnormal findings of blood chemistry: Secondary | ICD-10-CM | POA: Diagnosis not present

## 2020-01-18 DIAGNOSIS — Z0000 Encounter for general adult medical examination without abnormal findings: Secondary | ICD-10-CM

## 2020-01-18 DIAGNOSIS — Z6835 Body mass index (BMI) 35.0-35.9, adult: Secondary | ICD-10-CM | POA: Diagnosis not present

## 2020-01-18 DIAGNOSIS — Z1159 Encounter for screening for other viral diseases: Secondary | ICD-10-CM

## 2020-01-18 DIAGNOSIS — G43009 Migraine without aura, not intractable, without status migrainosus: Secondary | ICD-10-CM
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
## 2020-01-18 NOTE — Patient Instructions (Addendum)
It was very nice to meet you!  Please get your Covid vaccine.   Arrange fasting labs at your convenience.  Continue to work on diet and exercise.    Follow up pending lab findings and otherwise annual visit.     Preventive Care 32-32 Years Old, Female Preventive care refers to visits with your health care provider and lifestyle choices that can promote health and wellness. This includes:  A yearly physical exam. This may also be called an annual well check.  Regular dental visits and eye exams.  Immunizations.  Screening for certain conditions.  Healthy lifestyle choices, such as eating a healthy diet, getting regular exercise, not using drugs or products that contain nicotine and tobacco, and limiting alcohol use. What can I expect for my preventive care visit? Physical exam Your health care provider will check your:  Height and weight. This may be used to calculate body mass index (BMI), which tells if you are at a healthy weight.  Heart rate and blood pressure.  Skin for abnormal spots. Counseling Your health care provider may ask you questions about your:  Alcohol, tobacco, and drug use.  Emotional well-being.  Home and relationship well-being.  Sexual activity.  Eating habits.  Work and work Statistician.  Method of birth control.  Menstrual cycle.  Pregnancy history. What immunizations do I need?  Influenza (flu) vaccine  This is recommended every year. Tetanus, diphtheria, and pertussis (Tdap) vaccine  You may need a Td booster every 10 years. Varicella (chickenpox) vaccine  You may need this if you have not been vaccinated. Human papillomavirus (HPV) vaccine  If recommended by your health care provider, you may need three doses over 6 months. Measles, mumps, and rubella (MMR) vaccine  You may need at least one dose of MMR. You may also need a second dose. Meningococcal conjugate (MenACWY) vaccine  One dose is recommended if you are age  32-32 years and a first-year college student living in a residence hall, or if you have one of several medical conditions. You may also need additional booster doses. Pneumococcal conjugate (PCV13) vaccine  You may need this if you have certain conditions and were not previously vaccinated. Pneumococcal polysaccharide (PPSV23) vaccine  You may need one or two doses if you smoke cigarettes or if you have certain conditions. Hepatitis A vaccine  You may need this if you have certain conditions or if you travel or work in places where you may be exposed to hepatitis A. Hepatitis B vaccine  You may need this if you have certain conditions or if you travel or work in places where you may be exposed to hepatitis B. Haemophilus influenzae type b (Hib) vaccine  You may need this if you have certain conditions. You may receive vaccines as individual doses or as more than one vaccine together in one shot (combination vaccines). Talk with your health care provider about the risks and benefits of combination vaccines. What tests do I need?  Blood tests  Lipid and cholesterol levels. These may be checked every 5 years starting at age 32.  Hepatitis C test.  Hepatitis B test. Screening  Diabetes screening. This is done by checking your blood sugar (glucose) after you have not eaten for a while (fasting).  Sexually transmitted disease (STD) testing.  BRCA-related cancer screening. This may be done if you have a family history of breast, ovarian, tubal, or peritoneal cancers.  Pelvic exam and Pap test. This may be done every 3 years starting at  age 32. Starting at age 32, this may be done every 5 years if you have a Pap test in combination with an HPV test. Talk with your health care provider about your test results, treatment options, and if necessary, the need for more tests. Follow these instructions at home: Eating and drinking   Eat a diet that includes fresh fruits and vegetables, whole  grains, lean protein, and low-fat dairy.  Take vitamin and mineral supplements as recommended by your health care provider.  Do not drink alcohol if: ? Your health care provider tells you not to drink. ? You are pregnant, may be pregnant, or are planning to become pregnant.  If you drink alcohol: ? Limit how much you have to 0-1 drink a day. ? Be aware of how much alcohol is in your drink. In the [place] one drink equals one 12 oz bottle of beer (355 mL), one 5 oz glass of wine (148 mL), or one 1 oz glass of hard liquor (44 mL). Lifestyle  Take daily care of your teeth and gums.  Stay active. Exercise for at least 30 minutes on 5 or more days each week.  Do not use any products that contain nicotine or tobacco, such as cigarettes, e-cigarettes, and chewing tobacco. If you need help quitting, ask your health care provider.  If you are sexually active, practice safe sex. Use a condom or other form of birth control (contraception) in order to prevent pregnancy and STIs (sexually transmitted infections). If you plan to become pregnant, see your health care provider for a preconception visit. What's next?  Visit your health care provider once a year for a well check visit.  Ask your health care provider how often you should have your eyes and teeth checked.  Stay up to date on all vaccines. This information is not intended to replace advice given to you by your health care provider. Make sure you discuss any questions you have with your health care provider. Document Revised: 01/27/2018 Document Reviewed: 01/27/2018 Elsevier Patient Education  2020 [employer].

## 2020-01-21 ENCOUNTER — Encounter: Payer: Self-pay | Admitting: Nurse Practitioner

## 2020-01-26 ENCOUNTER — Other Ambulatory Visit (INDEPENDENT_AMBULATORY_CARE_PROVIDER_SITE_OTHER): Payer: BC Managed Care – PPO

## 2020-01-26 ENCOUNTER — Other Ambulatory Visit: Payer: Self-pay

## 2020-01-26 DIAGNOSIS — Z1159 Encounter for screening for other viral diseases: Secondary | ICD-10-CM

## 2020-01-26 DIAGNOSIS — R7989 Other specified abnormal findings of blood chemistry: Secondary | ICD-10-CM

## 2020-01-26 DIAGNOSIS — Z6835 Body mass index (BMI) 35.0-35.9, adult: Secondary | ICD-10-CM | POA: Diagnosis not present

## 2020-01-26 DIAGNOSIS — G43009 Migraine without aura, not intractable, without status migrainosus: Secondary | ICD-10-CM

## 2020-01-26 DIAGNOSIS — Z0000 Encounter for general adult medical examination without abnormal findings: Secondary | ICD-10-CM | POA: Diagnosis not present

## 2020-01-26 LAB — B12 AND FOLATE PANEL
Folate: 22.7 ng/mL (ref >5.9)
Vitamin B-12: 341 pg/mL (ref 211–911)

## 2020-01-26 LAB — CBC WITH DIFFERENTIAL/PLATELET
Basophils Absolute: 0 10*3/uL (ref 0.0–0.1)
Basophils Relative: 0.5 % (ref 0.0–3.0)
Eosinophils Absolute: 0.1 10*3/uL (ref 0.0–0.7)
Eosinophils Relative: 2.1 % (ref 0.0–5.0)
HCT: 39.5 % (ref 36.0–46.0)
Hemoglobin: 13.6 g/dL (ref 12.0–15.0)
Lymphocytes Relative: 37.6 % (ref 12.0–46.0)
Lymphs Abs: 2.2 10*3/uL (ref 0.7–4.0)
MCHC: 34.5 g/dL (ref 30.0–36.0)
MCV: 85.5 fl (ref 78.0–100.0)
Monocytes Absolute: 0.5 10*3/uL (ref 0.1–1.0)
Monocytes Relative: 8.4 % (ref 3.0–12.0)
Neutro Abs: 3 10*3/uL (ref 1.4–7.7)
Neutrophils Relative %: 51.4 % (ref 43.0–77.0)
Platelets: 248 10*3/uL (ref 150.0–400.0)
RBC: 4.62 Mil/uL (ref 3.87–5.11)
RDW: 11.8 % (ref 11.5–15.5)
WBC: 5.8 10*3/uL (ref 4.0–10.5)

## 2020-01-26 LAB — LIPID PANEL
Cholesterol: 256 mg/dL — ABNORMAL HIGH (ref 0–200)
HDL: 58.8 mg/dL (ref >39.00)
NonHDL: 197.44
Total CHOL/HDL Ratio: 4
Triglycerides: 233 mg/dL — ABNORMAL HIGH (ref 0.0–149.0)
VLDL: 46.6 mg/dL — ABNORMAL HIGH (ref 0.0–40.0)

## 2020-01-26 LAB — VITAMIN D 25 HYDROXY (VIT D DEFICIENCY, FRACTURES): VITD: 34.21 ng/mL (ref 30.00–100.00)

## 2020-01-26 LAB — LDL CHOLESTEROL, DIRECT: Direct LDL: 153 mg/dL

## 2020-01-26 LAB — COMPREHENSIVE METABOLIC PANEL
ALT: 17 U/L (ref 0–35)
AST: 13 U/L (ref 0–37)
Albumin: 4.2 g/dL (ref 3.5–5.2)
Alkaline Phosphatase: 43 U/L (ref 39–117)
BUN: 11 mg/dL (ref 6–23)
CO2: 28 mEq/L (ref 19–32)
Calcium: 9.4 mg/dL (ref 8.4–10.5)
Chloride: 102 mEq/L (ref 96–112)
Creatinine, Ser: 0.85 mg/dL (ref 0.40–1.20)
GFR: 77.3 mL/min (ref >60.00)
Glucose, Bld: 80 mg/dL (ref 70–99)
Potassium: 4 mEq/L (ref 3.5–5.1)
Sodium: 139 mEq/L (ref 135–145)
Total Bilirubin: 0.5 mg/dL (ref 0.2–1.2)
Total Protein: 6.8 g/dL (ref 6.0–8.3)

## 2020-01-26 LAB — HEMOGLOBIN A1C: Hgb A1c MFr Bld: 4.6 % (ref 4.6–6.5)

## 2020-01-26 LAB — TSH: TSH: 3.1 u[IU]/mL (ref 0.35–4.50)

## 2020-03-06 ENCOUNTER — Other Ambulatory Visit: Payer: Self-pay | Admitting: Physician Assistant

## 2020-03-08 NOTE — Telephone Encounter (Signed)
Patient needs appointment.

## 2020-04-30 ENCOUNTER — Telehealth: Payer: Self-pay | Admitting: Nurse Practitioner

## 2020-04-30 NOTE — Telephone Encounter (Signed)
Chart reviewed.

## 2020-07-14 ENCOUNTER — Other Ambulatory Visit: Payer: Self-pay

## 2020-07-16 ENCOUNTER — Other Ambulatory Visit: Payer: Self-pay

## 2020-07-16 MED ORDER — METOPROLOL TARTRATE 25 MG PO TABS: 25.0000 mg | ORAL_TABLET | Freq: Two times a day (BID) | ORAL | 2 refills | Status: DC

## 2020-07-20 ENCOUNTER — Other Ambulatory Visit: Payer: Self-pay | Admitting: Physician Assistant

## 2020-09-06 ENCOUNTER — Encounter: Payer: Self-pay | Admitting: Physician Assistant

## 2020-09-06 ENCOUNTER — Other Ambulatory Visit: Payer: Self-pay

## 2020-09-06 ENCOUNTER — Ambulatory Visit: Payer: BC Managed Care – PPO | Admitting: Physician Assistant

## 2020-09-06 VITALS — BP 122/82 | HR 67 | Wt 197.0 lb

## 2020-09-06 DIAGNOSIS — G43009 Migraine without aura, not intractable, without status migrainosus: Secondary | ICD-10-CM

## 2020-09-06 DIAGNOSIS — G4489 Other headache syndrome: Secondary | ICD-10-CM | POA: Diagnosis not present

## 2020-09-06 MED ORDER — METOPROLOL TARTRATE 25 MG PO TABS: 25.0000 mg | ORAL_TABLET | Freq: Two times a day (BID) | ORAL | 11 refills | Status: DC

## 2020-09-06 MED ORDER — SUMATRIPTAN SUCCINATE 100 MG PO TABS: ORAL_TABLET | ORAL | 11 refills | Status: DC

## 2020-09-06 NOTE — Patient Instructions (Signed)
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
## 2020-10-16 DIAGNOSIS — J019 Acute sinusitis, unspecified: Secondary | ICD-10-CM | POA: Diagnosis not present

## 2020-11-04 ENCOUNTER — Other Ambulatory Visit: Payer: Self-pay | Admitting: Physician Assistant

## 2020-12-14 DIAGNOSIS — S86111A Strain of other muscle(s) and tendon(s) of posterior muscle group at lower leg level, right leg, initial encounter: Secondary | ICD-10-CM | POA: Diagnosis not present

## 2021-02-11 ENCOUNTER — Encounter: Payer: Self-pay | Admitting: Obstetrics & Gynecology

## 2021-02-11 ENCOUNTER — Other Ambulatory Visit: Payer: Self-pay

## 2021-02-11 ENCOUNTER — Ambulatory Visit: Payer: BC Managed Care – PPO | Admitting: Obstetrics & Gynecology

## 2021-02-11 ENCOUNTER — Other Ambulatory Visit (HOSPITAL_COMMUNITY)
Admission: RE | Admit: 2021-02-11 | Discharge: 2021-02-11 | Disposition: A | Discharge status: Home / Self Care | Payer: BC Managed Care – PPO | Source: Ambulatory Visit | Attending: Obstetrics & Gynecology | Admitting: Obstetrics & Gynecology

## 2021-02-11 ENCOUNTER — Ambulatory Visit (INDEPENDENT_AMBULATORY_CARE_PROVIDER_SITE_OTHER): Payer: BC Managed Care – PPO | Admitting: Obstetrics & Gynecology

## 2021-02-11 VITALS — BP 123/87 | HR 71 | Ht 61.0 in | Wt 198.0 lb

## 2021-02-11 DIAGNOSIS — Z23 Encounter for immunization: Secondary | ICD-10-CM

## 2021-02-11 DIAGNOSIS — Z01419 Encounter for gynecological examination (general) (routine) without abnormal findings: Secondary | ICD-10-CM | POA: Insufficient documentation
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
## 2021-02-11 NOTE — Patient Instructions (Signed)
Preventive Care 21-33 Years Old, Female Preventive care refers to lifestyle choices and visits with your health care provider that can promote health and wellness. This includes: A yearly physical exam. This is also called an annual wellness visit. Regular dental and eye exams. Immunizations. Screening for certain conditions. Healthy lifestyle choices, such as: Eating a healthy diet. Getting regular exercise. Not using drugs or products that contain nicotine and tobacco. Limiting alcohol use. What can I expect for my preventive care visit? Physical exam Your health care provider may check your: Height and weight. These may be used to calculate your BMI (body mass index). BMI is a measurement that tells if you are at a healthy weight. Heart rate and blood pressure. Body temperature. Skin for abnormal spots. Counseling Your health care provider may ask you questions about your: Past medical problems. Family's medical history. Alcohol, tobacco, and drug use. Emotional well-being. Home life and relationship well-being. Sexual activity. Diet, exercise, and sleep habits. Work and work environment. Access to firearms. Method of birth control. Menstrual cycle. Pregnancy history. What immunizations do I need? Vaccines are usually given at various ages, according to a schedule. Your health care provider will recommend vaccines for you based on your age, medical history, and lifestyle or other factors, such as travel or where you work. What tests do I need? Blood tests Lipid and cholesterol levels. These may be checked every 5 years starting at age 20. Hepatitis C test. Hepatitis B test. Screening Diabetes screening. This is done by checking your blood sugar (glucose) after you have not eaten for a while (fasting). STD (sexually transmitted disease) testing, if you are at risk. BRCA-related cancer screening. This may be done if you have a family history of breast, ovarian, tubal, or  peritoneal cancers. Pelvic exam and Pap test. This may be done every 3 years starting at age 21. Starting at age 30, this may be done every 5 years if you have a Pap test in combination with an HPV test. Talk with your health care provider about your test results, treatment options, and if necessary, the need for more tests. Follow these instructions at home: Eating and drinking  Eat a healthy diet that includes fresh fruits and vegetables, whole grains, lean protein, and low-fat dairy products. Take vitamin and mineral supplements as recommended by your health care provider. Do not drink alcohol if: Your health care provider tells you not to drink. You are pregnant, may be pregnant, or are planning to become pregnant. If you drink alcohol: Limit how much you have to 0-1 drink a day. Be aware of how much alcohol is in your drink. In the [place] one drink equals one 12 oz bottle of beer (355 mL), one 5 oz glass of wine (148 mL), or one 1 oz glass of hard liquor (44 mL). Lifestyle Take daily care of your teeth and gums. Brush your teeth every morning and night with fluoride toothpaste. Floss one time each day. Stay active. Exercise for at least 30 minutes 5 or more days each week. Do not use any products that contain nicotine or tobacco, such as cigarettes, e-cigarettes, and chewing tobacco. If you need help quitting, ask your health care provider. Do not use drugs. If you are sexually active, practice safe sex. Use a condom or other form of protection to prevent STIs (sexually transmitted infections). If you do not wish to become pregnant, use a form of birth control. If you plan to become pregnant, see your health care provider   for a prepregnancy visit. Find healthy ways to cope with stress, such as: Meditation, yoga, or listening to music. Journaling. Talking to a trusted person. Spending time with friends and family. Safety Always wear your seat belt while driving or riding in a  vehicle. Do not drive: If you have been drinking alcohol. Do not ride with someone who has been drinking. When you are tired or distracted. While texting. Wear a helmet and other protective equipment during sports activities. If you have firearms in your house, make sure you follow all gun safety procedures. Seek help if you have been physically or sexually abused. What's next? Go to your health care provider once a year for an annual wellness visit. Ask your health care provider how often you should have your eyes and teeth checked. Stay up to date on all vaccines. This information is not intended to replace advice given to you by your health care provider. Make sure you discuss any questions you have with your health care provider. Document Revised: 07/26/2020 Document Reviewed: 01/27/2018 Elsevier Patient Education  2022 Elsevier Inc.  

## 2021-02-12 LAB — TSH: TSH: 3.56 u[IU]/mL (ref 0.450–4.500)

## 2021-02-12 LAB — COMPREHENSIVE METABOLIC PANEL
ALT: 15 IU/L (ref 0–32)
AST: 11 IU/L (ref 0–40)
Albumin/Globulin Ratio: 2.2 (ref 1.2–2.2)
Albumin: 4.7 g/dL (ref 3.8–4.8)
Alkaline Phosphatase: 58 IU/L (ref 44–121)
BUN/Creatinine Ratio: 15 (ref 9–23)
BUN: 12 mg/dL (ref 6–20)
Bilirubin Total: 0.4 mg/dL (ref 0.0–1.2)
CO2: 23 mmol/L (ref 20–29)
Calcium: 9.4 mg/dL (ref 8.7–10.2)
Chloride: 101 mmol/L (ref 96–106)
Creatinine, Ser: 0.8 mg/dL (ref 0.57–1.00)
Globulin, Total: 2.1 g/dL (ref 1.5–4.5)
Glucose: 83 mg/dL (ref 65–99)
Potassium: 4.2 mmol/L (ref 3.5–5.2)
Sodium: 140 mmol/L (ref 134–144)
Total Protein: 6.8 g/dL (ref 6.0–8.5)
eGFR: 100 mL/min/{1.73_m2} (ref >59)

## 2021-02-12 LAB — CBC
Hematocrit: 41 % (ref 34.0–46.6)
Hemoglobin: 13.7 g/dL (ref 11.1–15.9)
MCH: 28.8 pg (ref 26.6–33.0)
MCHC: 33.4 g/dL (ref 31.5–35.7)
MCV: 86 fL (ref 79–97)
Platelets: 294 10*3/uL (ref 150–450)
RBC: 4.76 x10E6/uL (ref 3.77–5.28)
RDW: 11.9 % (ref 11.7–15.4)
WBC: 7.7 10*3/uL (ref 3.4–10.8)

## 2021-02-12 LAB — LIPID PANEL
Chol/HDL Ratio: 3.9 ratio (ref 0.0–4.4)
Cholesterol, Total: 207 mg/dL — ABNORMAL HIGH (ref 100–199)
HDL: 53 mg/dL (ref >39)
LDL Chol Calc (NIH): 125 mg/dL — ABNORMAL HIGH (ref 0–99)
Triglycerides: 164 mg/dL — ABNORMAL HIGH (ref 0–149)
VLDL Cholesterol Cal: 29 mg/dL (ref 5–40)

## 2021-02-12 LAB — HEMOGLOBIN A1C
Est. average glucose Bld gHb Est-mCnc: 88 mg/dL
Hgb A1c MFr Bld: 4.7 % — ABNORMAL LOW (ref 4.8–5.6)

## 2021-02-14 LAB — CYTOLOGY - PAP
Comment: NEGATIVE
Diagnosis: NEGATIVE
High risk HPV: NEGATIVE

## 2021-09-23 ENCOUNTER — Encounter: Payer: Self-pay | Admitting: Physician Assistant

## 2021-09-23 ENCOUNTER — Ambulatory Visit: Payer: BC Managed Care – PPO | Admitting: Physician Assistant

## 2021-09-23 VITALS — BP 113/77 | HR 71 | Wt 198.0 lb

## 2021-09-23 DIAGNOSIS — G4489 Other headache syndrome: Secondary | ICD-10-CM

## 2021-09-23 DIAGNOSIS — G43009 Migraine without aura, not intractable, without status migrainosus: Secondary | ICD-10-CM | POA: Diagnosis not present

## 2021-09-23 MED ORDER — METOPROLOL TARTRATE 50 MG PO TABS: 50.0000 mg | ORAL_TABLET | Freq: Every day | ORAL | 3 refills | Status: DC

## 2021-09-23 MED ORDER — SUMATRIPTAN SUCCINATE 100 MG PO TABS: ORAL_TABLET | ORAL | 3 refills | Status: DC
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
## 2021-09-23 NOTE — Patient Instructions (Signed)
Migraine Headache A migraine headache is an intense, throbbing pain on one side or both sides of the head. Migraine headaches may also cause other symptoms, such as nausea, vomiting, and sensitivity to light and noise. A migraine headache can last from 4 hours to 3 days. Talk with your doctor about what things may bring on (trigger) your migraine headaches. What are the causes? The exact cause of this condition is not known. However, a migraine may be caused when nerves in the brain become irritated and release chemicals that cause inflammation of blood vessels. This inflammation causes pain. This condition may be triggered or caused by: Drinking alcohol. Smoking. Taking medicines, such as: Medicine used to treat chest pain (nitroglycerin). Birth control pills. Estrogen. Certain blood pressure medicines. Eating or drinking products that contain nitrates, glutamate, aspartame, or tyramine. Aged cheeses, chocolate, or caffeine may also be triggers. Doing physical activity. Other things that may trigger a migraine headache include: Menstruation. Pregnancy. Hunger. Stress. Lack of sleep or too much sleep. Weather changes. Fatigue. What increases the risk? The following factors may make you more likely to experience migraine headaches: Being a certain age. This condition is more common in people who are 25-55 years old. Being female. Having a family history of migraine headaches. Being Caucasian. Having a mental health condition, such as depression or anxiety. Being obese. What are the signs or symptoms? The main symptom of this condition is pulsating or throbbing pain. This pain may: Happen in any area of the head, such as on one side or both sides. Interfere with daily activities. Get worse with physical activity. Get worse with exposure to bright lights or loud noises. Other symptoms may include: Nausea. Vomiting. Dizziness. General sensitivity to bright lights, loud noises, or  smells. Before you get a migraine headache, you may get warning signs (an aura). An aura may include: Seeing flashing lights or having blind spots. Seeing bright spots, halos, or zigzag lines. Having tunnel vision or blurred vision. Having numbness or a tingling feeling. Having trouble talking. Having muscle weakness. Some people have symptoms after a migraine headache (postdromal phase), such as: Feeling tired. Difficulty concentrating. How is this diagnosed? A migraine headache can be diagnosed based on: Your symptoms. A physical exam. Tests, such as: CT scan or an MRI of the head. These imaging tests can help rule out other causes of headaches. Taking fluid from the spine (lumbar puncture) and analyzing it (cerebrospinal fluid analysis, or CSF analysis). How is this treated? This condition may be treated with medicines that: Relieve pain. Relieve nausea. Prevent migraine headaches. Treatment for this condition may also include: Acupuncture. Lifestyle changes like avoiding foods that trigger migraine headaches. Biofeedback. Cognitive behavioral therapy. Follow these instructions at home: Medicines Take over-the-counter and prescription medicines only as told by your health care provider. Ask your health care provider if the medicine prescribed to you: Requires you to avoid driving or using heavy machinery. Can cause constipation. You may need to take these actions to prevent or treat constipation: Drink enough fluid to keep your urine pale yellow. Take over-the-counter or prescription medicines. Eat foods that are high in fiber, such as beans, whole grains, and fresh fruits and vegetables. Limit foods that are high in fat and processed sugars, such as fried or sweet foods. Lifestyle Do not drink alcohol. Do not use any products that contain nicotine or tobacco, such as cigarettes, e-cigarettes, and chewing tobacco. If you need help quitting, ask your health care  provider. Get at least 8   hours of sleep every night. Find ways to manage stress, such as meditation, deep breathing, or yoga. General instructions     Keep a journal to find out what may trigger your migraine headaches. For example, write down: What you eat and drink. How much sleep you get. Any change to your diet or medicines. If you have a migraine headache: Avoid things that make your symptoms worse, such as bright lights. It may help to lie down in a dark, quiet room. Do not drive or use heavy machinery. Ask your health care provider what activities are safe for you while you are experiencing symptoms. Keep all follow-up visits as told by your health care provider. This is important. Contact a health care provider if: You develop symptoms that are different or more severe than your usual migraine headache symptoms. You have more than 15 headache days in one month. Get help right away if: Your migraine headache becomes severe. Your migraine headache lasts longer than 72 hours. You have a fever. You have a stiff neck. You have vision loss. Your muscles feel weak or like you cannot control them. You start to lose your balance often. You have trouble walking. You faint. You have a seizure. Summary A migraine headache is an intense, throbbing pain on one side or both sides of the head. Migraines may also cause other symptoms, such as nausea, vomiting, and sensitivity to light and noise. This condition may be treated with medicines and lifestyle changes. You may also need to avoid certain things that trigger a migraine headache. Keep a journal to find out what may trigger your migraine headaches. Contact your health care provider if you have more than 15 headache days in a month or you develop symptoms that are different or more severe than your usual migraine headache symptoms. This information is not intended to replace advice given to you by your health care provider. Make sure  you discuss any questions you have with your health care provider. Document Revised: 09/09/2018 Document Reviewed: 06/30/2018 Elsevier Patient Education  2023 Elsevier Inc.  

## 2021-11-30 DIAGNOSIS — N3000 Acute cystitis without hematuria: Secondary | ICD-10-CM | POA: Diagnosis not present

## 2021-12-24 DIAGNOSIS — D2271 Melanocytic nevi of right lower limb, including hip: Secondary | ICD-10-CM | POA: Diagnosis not present

## 2021-12-24 DIAGNOSIS — D2262 Melanocytic nevi of left upper limb, including shoulder: Secondary | ICD-10-CM | POA: Diagnosis not present

## 2021-12-24 DIAGNOSIS — D225 Melanocytic nevi of trunk: Secondary | ICD-10-CM | POA: Diagnosis not present

## 2021-12-24 DIAGNOSIS — D2261 Melanocytic nevi of right upper limb, including shoulder: Secondary | ICD-10-CM | POA: Diagnosis not present

## 2022-02-17 ENCOUNTER — Encounter: Payer: Self-pay | Admitting: Obstetrics and Gynecology

## 2022-02-17 ENCOUNTER — Other Ambulatory Visit (HOSPITAL_COMMUNITY)
Admission: RE | Admit: 2022-02-17 | Discharge: 2022-02-17 | Disposition: A | Discharge status: Home / Self Care | Payer: BC Managed Care – PPO | Source: Ambulatory Visit | Attending: Obstetrics and Gynecology | Admitting: Obstetrics and Gynecology

## 2022-02-17 ENCOUNTER — Ambulatory Visit (INDEPENDENT_AMBULATORY_CARE_PROVIDER_SITE_OTHER): Payer: BC Managed Care – PPO | Admitting: Obstetrics and Gynecology

## 2022-02-17 VITALS — BP 129/88 | HR 69 | Ht 62.0 in | Wt 202.0 lb

## 2022-02-17 DIAGNOSIS — Z01419 Encounter for gynecological examination (general) (routine) without abnormal findings: Secondary | ICD-10-CM
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
## 2022-02-17 NOTE — Progress Notes (Signed)
Annual   Last pap: 02/11/21 WNL  Pt would like pap. LMP:01/25/22 last 5-7 days Monthly  Contraception:None  Family Hx of Breast Cancer:  None  STD Screening: Declines   CC: None

## 2022-02-18 LAB — CYTOLOGY - PAP
Comment: NEGATIVE
Diagnosis: NEGATIVE
High risk HPV: NEGATIVE

## 2022-02-18 LAB — LIPID PANEL
Chol/HDL Ratio: 3.8 ratio (ref 0.0–4.4)
Cholesterol, Total: 209 mg/dL — ABNORMAL HIGH (ref 100–199)
HDL: 55 mg/dL (ref >39)
LDL Chol Calc (NIH): 128 mg/dL — ABNORMAL HIGH (ref 0–99)
Triglycerides: 144 mg/dL (ref 0–149)
VLDL Cholesterol Cal: 26 mg/dL (ref 5–40)

## 2022-02-18 LAB — COMPREHENSIVE METABOLIC PANEL
ALT: 19 IU/L (ref 0–32)
AST: 16 IU/L (ref 0–40)
Albumin/Globulin Ratio: 2.2 (ref 1.2–2.2)
Albumin: 4.9 g/dL (ref 3.9–4.9)
Alkaline Phosphatase: 63 IU/L (ref 44–121)
BUN/Creatinine Ratio: 11 (ref 9–23)
BUN: 10 mg/dL (ref 6–20)
Bilirubin Total: 0.6 mg/dL (ref 0.0–1.2)
CO2: 21 mmol/L (ref 20–29)
Calcium: 9.7 mg/dL (ref 8.7–10.2)
Chloride: 98 mmol/L (ref 96–106)
Creatinine, Ser: 0.87 mg/dL (ref 0.57–1.00)
Globulin, Total: 2.2 g/dL (ref 1.5–4.5)
Glucose: 95 mg/dL (ref 70–99)
Potassium: 3.6 mmol/L (ref 3.5–5.2)
Sodium: 136 mmol/L (ref 134–144)
Total Protein: 7.1 g/dL (ref 6.0–8.5)
eGFR: 90 mL/min/{1.73_m2} (ref >59)

## 2022-02-18 LAB — TSH: TSH: 3.92 u[IU]/mL (ref 0.450–4.500)

## 2022-02-18 LAB — CBC
Hematocrit: 42.5 % (ref 34.0–46.6)
Hemoglobin: 14.3 g/dL (ref 11.1–15.9)
MCH: 29.6 pg (ref 26.6–33.0)
MCHC: 33.6 g/dL (ref 31.5–35.7)
MCV: 88 fL (ref 79–97)
Platelets: 307 10*3/uL (ref 150–450)
RBC: 4.83 x10E6/uL (ref 3.77–5.28)
RDW: 11.8 % (ref 11.7–15.4)
WBC: 7.8 10*3/uL (ref 3.4–10.8)

## 2022-02-18 LAB — HEMOGLOBIN A1C
Est. average glucose Bld gHb Est-mCnc: 88 mg/dL
Hgb A1c MFr Bld: 4.7 % — ABNORMAL LOW (ref 4.8–5.6)

## 2022-05-18 ENCOUNTER — Other Ambulatory Visit: Payer: Self-pay | Admitting: Physician Assistant

## 2022-08-07 DIAGNOSIS — N3000 Acute cystitis without hematuria: Secondary | ICD-10-CM | POA: Diagnosis not present

## 2022-09-15 ENCOUNTER — Other Ambulatory Visit: Payer: Self-pay | Admitting: Physician Assistant

## 2022-09-25 ENCOUNTER — Encounter: Payer: Self-pay | Admitting: Physician Assistant

## 2022-09-25 ENCOUNTER — Ambulatory Visit: Payer: BC Managed Care – PPO | Admitting: Physician Assistant

## 2022-09-25 VITALS — BP 123/81 | HR 84 | Wt 199.0 lb

## 2022-09-25 DIAGNOSIS — G43009 Migraine without aura, not intractable, without status migrainosus: Secondary | ICD-10-CM | POA: Diagnosis not present

## 2022-09-25 DIAGNOSIS — G4489 Other headache syndrome: Secondary | ICD-10-CM | POA: Diagnosis not present

## 2022-09-25 MED ORDER — METOPROLOL TARTRATE 50 MG PO TABS: ORAL_TABLET | ORAL | 3 refills | Status: DC

## 2022-09-25 MED ORDER — SUMATRIPTAN SUCCINATE 100 MG PO TABS: ORAL_TABLET | ORAL | 3 refills | Status: DC

## 2022-09-25 MED ORDER — SUMATRIPTAN SUCCINATE 100 MG PO TABS: 100.0000 mg | ORAL_TABLET | ORAL | 3 refills | Status: DC | PRN

## 2022-09-25 NOTE — Patient Instructions (Signed)
Migraine Headache A migraine headache is a very strong throbbing pain on one or both sides of your head. This type of headache can also cause other symptoms. It can last from 4 hours to 3 days. Talk with your doctor about what things may bring on (trigger) this condition. What are the causes? The exact cause of a migraine is not known. This condition may be brought on or caused by: Smoking. Medicines, such as: Medicine used to treat chest pain (nitroglycerin). Birth control pills. Estrogen. Some blood pressure medicines. Certain substances in some foods or drinks. Foods and drinks, such as: Cheese. Chocolate. Alcohol. Caffeine. Doing physical activity that is very hard. Other things that may trigger a migraine headache include: Periods. Pregnancy. Hunger. Stress. Getting too much or too little sleep. Weather changes. Feeling tired (fatigue). What increases the risk? Being 25-55 years old. Being female. Having a family history of migraine headaches. Being Caucasian. Having a mental health condition, such as being sad (depressed) or feeling worried or nervous (anxious). Being very overweight (obese). What are the signs or symptoms? A throbbing pain. This pain may: Happen in any area of the head, such as on one or both sides. Make it hard to do daily activities. Get worse with physical activity. Get worse around bright lights, loud noises, or smells. Other symptoms may include: Feeling like you may vomit (nauseous). Vomiting. Dizziness. Before a migraine headache starts, you may get warning signs (an aura). An aura may include: Seeing flashing lights or having blind spots. Seeing bright spots, halos, or zigzag lines. Having tunnel vision or blurred vision. Having numbness or a tingling feeling. Having trouble talking. Having weak muscles. After a migraine ends, you may have symptoms. These may include: Tiredness. Trouble thinking (concentrating). How is this  treated? Taking medicines that: Relieve pain. Relieve the feeling like you may vomit. Prevent migraine headaches. Treatment may also include: Acupuncture. Lifestyle changes like avoiding foods that bring on migraine headaches. Learning ways to control your body functions (biofeedback). Therapy to help you know and deal with negative thoughts (cognitive behavioral therapy). Follow these instructions at home: Medicines Take over-the-counter and prescription medicines only as told by your doctor. If told, take steps to prevent problems with pooping (constipation). You may need to: Drink enough fluid to keep your pee (urine) pale yellow. Take medicines. You will be told what medicines to take. Eat foods that are high in fiber. These include beans, whole grains, and fresh fruits and vegetables. Limit foods that are high in fat and sugar. These include fried or sweet foods. Ask your doctor if you should avoid driving or using machines while you are taking your medicine. Lifestyle  Do not drink alcohol. Do not smoke or use any products that contain nicotine or tobacco. If you need help quitting, ask your doctor. Get 7-9 hours of sleep each night, or the amount recommended by your doctor. Find ways to deal with stress, such as meditation, deep breathing, or yoga. Try to exercise often. This can help lessen how bad and how often your migraines happen. General instructions Keep a journal to find out what may bring on your migraine headaches. This can help you avoid those things. For example, write down: What you eat and drink. How much sleep you get. Any change to your medicines or diet. If you have a migraine headache: Avoid things that make your symptoms worse, such as bright lights. Lie down in a dark, quiet room. Do not drive or use machinery. Ask your   doctor what activities are safe for you. Where to find more information Coalition for Headache and Migraine Patients (CHAMP):  headachemigraine.org American Migraine Foundation: americanmigrainefoundation.org National Headache Foundation: headaches.org Contact a doctor if: You get a migraine headache that is different or worse than others you have had. You have more than 15 days of headaches in one month. Get help right away if: Your migraine headache gets very bad. Your migraine headache lasts more than 72 hours. You have a fever or stiff neck. You have trouble seeing. Your muscles feel weak or like you cannot control them. You lose your balance a lot. You have trouble walking. You faint. You have a seizure. This information is not intended to replace advice given to you by your health care provider. Make sure you discuss any questions you have with your health care provider. Document Revised: 01/12/2022 Document Reviewed: 01/12/2022 Elsevier Patient Education  2023 Elsevier Inc.  
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
## 2023-02-19 ENCOUNTER — Other Ambulatory Visit (HOSPITAL_COMMUNITY)
Admission: RE | Admit: 2023-02-19 | Discharge: 2023-02-19 | Disposition: A | Discharge status: Home / Self Care | Payer: BC Managed Care – PPO | Source: Ambulatory Visit | Attending: Family Medicine | Admitting: Family Medicine

## 2023-02-19 ENCOUNTER — Encounter: Payer: Self-pay | Admitting: Family Medicine

## 2023-02-19 ENCOUNTER — Ambulatory Visit: Payer: BC Managed Care – PPO | Admitting: Family Medicine

## 2023-02-19 VITALS — BP 134/90 | HR 74 | Ht 62.0 in | Wt 197.4 lb

## 2023-02-19 DIAGNOSIS — Z01419 Encounter for gynecological examination (general) (routine) without abnormal findings: Secondary | ICD-10-CM | POA: Insufficient documentation

## 2023-02-19 DIAGNOSIS — Z0000 Encounter for general adult medical examination without abnormal findings: Secondary | ICD-10-CM | POA: Diagnosis not present

## 2023-02-19 DIAGNOSIS — Z23 Encounter for immunization: Secondary | ICD-10-CM

## 2023-02-19 NOTE — Progress Notes (Signed)
   GYNECOLOGY ANNUAL PREVENTATIVE CARE ENCOUNTER NOTE  Subjective:   [name] [name] is a 37 y.o. [phone] female here for a routine annual gynecologic exam.  Current complaints: None- doing well. No concerns.   Denies abnormal vaginal bleeding, discharge, pelvic pain, problems with intercourse or other gynecologic concerns.    Gynecologic History No LMP recorded. Contraception: vasectomy Last Pap: 2023. Results were: normal-- informed she can wait 5 years but prefers to have pap today Last mammogram: NA.   Health Maintenance Due  Topic Date Due   INFLUENZA VACCINE  12/31/2022   COVID-19 Vaccine (1 - 2023-24 season) Never done    The following portions of the patient's history were reviewed and updated as appropriate: allergies, current medications, past family history, past medical history, past social history, past surgical history and problem list.  Review of Systems Pertinent items are noted in HPI.   Objective:  BP (!) 134/90   Pulse 74   Ht 5\' 2"  (1.575 m)   Wt 197 lb 6.4 oz (89.5 kg)   BMI 36.10 kg/m  CONSTITUTIONAL: Well-developed, well-nourished female in no acute distress.  HENT:  Normocephalic, atraumatic, External right and left ear normal. Oropharynx is clear and moist EYES:  No scleral icterus.  NECK: Normal range of motion, supple, no masses.  Normal thyroid.  SKIN: Skin is warm and dry. No rash noted. Not diaphoretic. No erythema. No pallor. NEUROLOGIC: Alert and oriented to person, place, and time. Normal reflexes, muscle tone coordination. No cranial nerve deficit noted. PSYCHIATRIC: Normal mood and affect. Normal behavior. Normal judgment and thought content. CARDIOVASCULAR: Normal heart rate noted, regular rhythm. 2+ distal pulses. RESPIRATORY: Effort and breath sounds normal, no problems with respiration noted. BREASTS: Symmetric in size. No masses, skin changes, nipple drainage, or lymphadenopathy. ABDOMEN: Soft,  no distention noted.  No tenderness, rebound  or guarding.  PELVIC: Normal appearing external genitalia; normal appearing vaginal mucosa and cervix.  No abnormal discharge noted.  Pap smear obtained.  Normal uterine size, no other palpable masses, no uterine or adnexal tenderness. Chaperone present for exam MUSCULOSKELETAL: Normal range of motion.   Assessment and Plan:  1) Annual gynecologic examination with pap smear:  Will follow up results of pap smear and manage accordingly. Declined STI screening.  Routine preventative health maintenance measures emphasized.  2) Contraception counseling: partner is s/p vasectomy  1. Well woman exam with routine gynecological exam Up to date Encouraged her to establish PCP care Reviewed start of CRC in 40s Encouraged annual vaccinations - Cytology - PAP - Flu vaccine trivalent PF, 6mos and older(Flulaval,Afluria,Fluarix,Fluzone) - Lipid panel - TSH Rfx on Abnormal to Free T4 - Hemoglobin A1c - VITAMIN D 25 Hydroxy (Vit-D Deficiency, Fractures)    Please refer to After Visit Summary for other counseling recommendations.   No follow-ups on file.  Federico Flake, MD, MPH, ABFM Attending [facility]

## 2023-02-19 NOTE — Progress Notes (Signed)
Patient presents for Annual.  LMP: 02/10/23 Last pap: 02/17/22-WNL  Contraception:  Husband had Vasectomy  Mammogram: Not yet indicated STD Screening: Declines Flu Vaccine : Accepts  CC:  Annual Denies any concerns

## 2023-02-20 LAB — TSH RFX ON ABNORMAL TO FREE T4: TSH: 2.63 u[IU]/mL (ref 0.450–4.500)

## 2023-02-20 LAB — LIPID PANEL
Chol/HDL Ratio: 4.7 ratio — ABNORMAL HIGH (ref 0.0–4.4)
Cholesterol, Total: 253 mg/dL — ABNORMAL HIGH (ref 100–199)
HDL: 54 mg/dL (ref >39)
LDL Chol Calc (NIH): 181 mg/dL — ABNORMAL HIGH (ref 0–99)
Triglycerides: 101 mg/dL (ref 0–149)
VLDL Cholesterol Cal: 18 mg/dL (ref 5–40)

## 2023-02-20 LAB — VITAMIN D 25 HYDROXY (VIT D DEFICIENCY, FRACTURES): Vit D, 25-Hydroxy: 51.8 ng/mL (ref 30.0–100.0)

## 2023-02-20 LAB — HEMOGLOBIN A1C
Est. average glucose Bld gHb Est-mCnc: 91 mg/dL
Hgb A1c MFr Bld: 4.8 % (ref 4.8–5.6)

## 2023-02-24 ENCOUNTER — Ambulatory Visit: Payer: BC Managed Care – PPO | Admitting: Family Medicine

## 2023-02-25 LAB — CYTOLOGY - PAP
Comment: NEGATIVE
Diagnosis: NEGATIVE
Diagnosis: REACTIVE
High risk HPV: NEGATIVE

## 2023-09-03 ENCOUNTER — Ambulatory Visit: Payer: BC Managed Care – PPO | Admitting: Physician Assistant

## 2023-09-03 ENCOUNTER — Encounter: Payer: Self-pay | Admitting: Physician Assistant

## 2023-09-03 VITALS — BP 121/84 | HR 69 | Wt 201.0 lb

## 2023-09-03 DIAGNOSIS — G4489 Other headache syndrome: Secondary | ICD-10-CM | POA: Diagnosis not present

## 2023-09-03 DIAGNOSIS — G43009 Migraine without aura, not intractable, without status migrainosus: Secondary | ICD-10-CM | POA: Diagnosis not present

## 2023-09-03 MED ORDER — METOPROLOL TARTRATE 50 MG PO TABS: ORAL_TABLET | ORAL | 4 refills | Status: AC

## 2023-09-03 MED ORDER — SUMATRIPTAN SUCCINATE 100 MG PO TABS: 100.0000 mg | ORAL_TABLET | ORAL | 3 refills | Status: AC | PRN
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
## 2023-09-03 NOTE — Patient Instructions (Signed)
 Migraine Headache A migraine headache is a very strong throbbing pain on one or both sides of your head. This type of headache can also cause other symptoms. It can last from 4 hours to 3 days. Talk with your doctor about what things may bring on (trigger) this condition. What are the causes? The exact cause of a migraine is not known. This condition may be brought on or caused by: Smoking. Medicines, such as: Medicine used to treat chest pain (nitroglycerin). Birth control pills. Estrogen. Some blood pressure medicines. Certain substances in some foods or drinks. Foods and drinks, such as: Cheese. Chocolate. Alcohol. Caffeine. Doing physical activity that is very hard. Other things that may trigger a migraine headache include: Periods. Pregnancy. Hunger. Stress. Getting too much or too little sleep. Weather changes. Feeling tired (fatigue). What increases the risk? Being 18-65 years old. Being female. Having a family history of migraine headaches. Being Caucasian. Having a mental health condition, such as being sad (depressed) or feeling worried or nervous (anxious). Being very overweight (obese). What are the signs or symptoms? A throbbing pain. This pain may: Happen in any area of the head, such as on one or both sides. Make it hard to do daily activities. Get worse with physical activity. Get worse around bright lights, loud noises, or smells. Other symptoms may include: Feeling like you may vomit (nauseous). Vomiting. Dizziness. Before a migraine headache starts, you may get warning signs (an aura). An aura may include: Seeing flashing lights or having blind spots. Seeing bright spots, halos, or zigzag lines. Having tunnel vision or blurred vision. Having numbness or a tingling feeling. Having trouble talking. Having weak muscles. After a migraine ends, you may have symptoms. These may include: Tiredness. Trouble thinking (concentrating). How is this  treated? Taking medicines that: Relieve pain. Relieve the feeling like you may vomit. Prevent migraine headaches. Treatment may also include: Acupuncture. Lifestyle changes like avoiding foods that bring on migraine headaches. Learning ways to control your body functions (biofeedback). Therapy to help you know and deal with negative thoughts (cognitive behavioral therapy). Follow these instructions at home: Medicines Take over-the-counter and prescription medicines only as told by your doctor. If told, take steps to prevent problems with pooping (constipation). You may need to: Drink enough fluid to keep your pee (urine) pale yellow. Take medicines. You will be told what medicines to take. Eat foods that are high in fiber. These include beans, whole grains, and fresh fruits and vegetables. Limit foods that are high in fat and sugar. These include fried or sweet foods. Ask your doctor if you should avoid driving or using machines while you are taking your medicine. Lifestyle  Do not drink alcohol. Do not smoke or use any products that contain nicotine or tobacco. If you need help quitting, ask your doctor. Get 7-9 hours of sleep each night, or the amount recommended by your doctor. Find ways to deal with stress, such as meditation, deep breathing, or yoga. Try to exercise often. This can help lessen how bad and how often your migraines happen. General instructions Keep a journal to find out what may bring on your migraine headaches. This can help you avoid those things. For example, write down: What you eat and drink. How much sleep you get. Any change to your medicines or diet. If you have a migraine headache: Avoid things that make your symptoms worse, such as bright lights. Lie down in a dark, quiet room. Do not drive or use machinery. Ask your  doctor what activities are safe for you. Where to find more information Coalition for Headache and Migraine Patients (CHAMP):  headachemigraine.org American Migraine Foundation: americanmigrainefoundation.org National Headache Foundation: headaches.org Contact a doctor if: You get a migraine headache that is different or worse than others you have had. You have more than 15 days of headaches in one month. Get help right away if: Your migraine headache gets very bad. Your migraine headache lasts more than 72 hours. You have a fever or stiff neck. You have trouble seeing. Your muscles feel weak or like you cannot control them. You lose your balance a lot. You have trouble walking. You faint. You have a seizure. This information is not intended to replace advice given to you by your health care provider. Make sure you discuss any questions you have with your health care provider. Document Revised: 01/12/2022 Document Reviewed: 01/12/2022 Elsevier Patient Education  2024 [employer].

## 2023-10-12 DIAGNOSIS — N3000 Acute cystitis without hematuria: Secondary | ICD-10-CM | POA: Diagnosis not present

## 2023-10-25 DIAGNOSIS — N3000 Acute cystitis without hematuria: Secondary | ICD-10-CM | POA: Diagnosis not present

## 2023-11-18 ENCOUNTER — Encounter: Payer: Self-pay | Admitting: Family Medicine

## 2023-12-15 DIAGNOSIS — Z0000 Encounter for general adult medical examination without abnormal findings: Secondary | ICD-10-CM | POA: Diagnosis not present

## 2023-12-15 DIAGNOSIS — Z1331 Encounter for screening for depression: Secondary | ICD-10-CM | POA: Diagnosis not present

## 2023-12-15 DIAGNOSIS — Z6836 Body mass index (BMI) 36.0-36.9, adult: Secondary | ICD-10-CM | POA: Diagnosis not present

## 2023-12-15 DIAGNOSIS — Z1159 Encounter for screening for other viral diseases: Secondary | ICD-10-CM | POA: Diagnosis not present

## 2023-12-15 DIAGNOSIS — Z3200 Encounter for pregnancy test, result unknown: Secondary | ICD-10-CM | POA: Diagnosis not present

## 2023-12-15 DIAGNOSIS — Z131 Encounter for screening for diabetes mellitus: Secondary | ICD-10-CM | POA: Diagnosis not present

## 2023-12-15 DIAGNOSIS — E559 Vitamin D deficiency, unspecified: Secondary | ICD-10-CM | POA: Diagnosis not present

## 2023-12-15 DIAGNOSIS — E6609 Other obesity due to excess calories: Secondary | ICD-10-CM | POA: Diagnosis not present

## 2023-12-15 DIAGNOSIS — Z114 Encounter for screening for human immunodeficiency virus [HIV]: Secondary | ICD-10-CM | POA: Diagnosis not present

## 2023-12-15 DIAGNOSIS — Z13220 Encounter for screening for lipoid disorders: Secondary | ICD-10-CM | POA: Diagnosis not present

## 2023-12-29 DIAGNOSIS — Z6837 Body mass index (BMI) 37.0-37.9, adult: Secondary | ICD-10-CM | POA: Diagnosis not present

## 2023-12-29 DIAGNOSIS — E6609 Other obesity due to excess calories: Secondary | ICD-10-CM | POA: Diagnosis not present

## 2023-12-29 DIAGNOSIS — E559 Vitamin D deficiency, unspecified: Secondary | ICD-10-CM | POA: Diagnosis not present

## 2023-12-29 DIAGNOSIS — E7800 Pure hypercholesterolemia, unspecified: Secondary | ICD-10-CM | POA: Diagnosis not present

## 2023-12-29 DIAGNOSIS — G43909 Migraine, unspecified, not intractable, without status migrainosus: Secondary | ICD-10-CM | POA: Diagnosis not present

## 2024-08-25 ENCOUNTER — Encounter: Admitting: Physician Assistant
